# Patient Record
Sex: Male | Born: 1935 | Race: White | Hispanic: No | Marital: Married | Smoking: Never smoker
Health system: Southern US, Community
[De-identification: ages and names within clinical notes are randomized; demographics above are authoritative.]

## PROBLEM LIST (undated history)

## (undated) DIAGNOSIS — R001 Bradycardia, unspecified: Secondary | ICD-10-CM

## (undated) DIAGNOSIS — Z8719 Personal history of other diseases of the digestive system: Secondary | ICD-10-CM

## (undated) DIAGNOSIS — E559 Vitamin D deficiency, unspecified: Secondary | ICD-10-CM

## (undated) DIAGNOSIS — G629 Polyneuropathy, unspecified: Secondary | ICD-10-CM

## (undated) DIAGNOSIS — K219 Gastro-esophageal reflux disease without esophagitis: Secondary | ICD-10-CM

## (undated) DIAGNOSIS — J449 Chronic obstructive pulmonary disease, unspecified: Secondary | ICD-10-CM

## (undated) DIAGNOSIS — R350 Frequency of micturition: Secondary | ICD-10-CM

## (undated) DIAGNOSIS — Z87442 Personal history of urinary calculi: Secondary | ICD-10-CM

## (undated) DIAGNOSIS — Z8546 Personal history of malignant neoplasm of prostate: Secondary | ICD-10-CM

## (undated) DIAGNOSIS — K649 Unspecified hemorrhoids: Secondary | ICD-10-CM

## (undated) DIAGNOSIS — B9681 Helicobacter pylori [H. pylori] as the cause of diseases classified elsewhere: Secondary | ICD-10-CM

## (undated) DIAGNOSIS — E785 Hyperlipidemia, unspecified: Secondary | ICD-10-CM

## (undated) DIAGNOSIS — K269 Duodenal ulcer, unspecified as acute or chronic, without hemorrhage or perforation: Principal | ICD-10-CM

## (undated) DIAGNOSIS — K222 Esophageal obstruction: Secondary | ICD-10-CM

## (undated) DIAGNOSIS — K5790 Diverticulosis of intestine, part unspecified, without perforation or abscess without bleeding: Secondary | ICD-10-CM

## (undated) DIAGNOSIS — C61 Malignant neoplasm of prostate: Secondary | ICD-10-CM

## (undated) DIAGNOSIS — K627 Radiation proctitis: Secondary | ICD-10-CM

## (undated) DIAGNOSIS — M8580 Other specified disorders of bone density and structure, unspecified site: Secondary | ICD-10-CM

## (undated) DIAGNOSIS — M1990 Unspecified osteoarthritis, unspecified site: Secondary | ICD-10-CM

## (undated) DIAGNOSIS — Z87898 Personal history of other specified conditions: Secondary | ICD-10-CM

## (undated) DIAGNOSIS — N400 Enlarged prostate without lower urinary tract symptoms: Secondary | ICD-10-CM

## (undated) DIAGNOSIS — Z8669 Personal history of other diseases of the nervous system and sense organs: Secondary | ICD-10-CM

## (undated) DIAGNOSIS — N529 Male erectile dysfunction, unspecified: Secondary | ICD-10-CM

## (undated) DIAGNOSIS — N201 Calculus of ureter: Secondary | ICD-10-CM

## (undated) DIAGNOSIS — K648 Other hemorrhoids: Secondary | ICD-10-CM

## (undated) DIAGNOSIS — G454 Transient global amnesia: Secondary | ICD-10-CM

## (undated) DIAGNOSIS — Z923 Personal history of irradiation: Secondary | ICD-10-CM

## (undated) DIAGNOSIS — H6690 Otitis media, unspecified, unspecified ear: Secondary | ICD-10-CM

## (undated) DIAGNOSIS — Z972 Presence of dental prosthetic device (complete) (partial): Secondary | ICD-10-CM

## (undated) DIAGNOSIS — H33319 Horseshoe tear of retina without detachment, unspecified eye: Secondary | ICD-10-CM

## (undated) HISTORY — DX: Polyneuropathy, unspecified: G62.9

## (undated) HISTORY — PX: TONSILLECTOMY AND ADENOIDECTOMY: SUR1326

## (undated) HISTORY — DX: Other specified disorders of bone density and structure, unspecified site: M85.80

## (undated) HISTORY — PX: OTHER SURGICAL HISTORY: SHX169

## (undated) HISTORY — DX: Diverticulosis of intestine, part unspecified, without perforation or abscess without bleeding: K57.90

## (undated) HISTORY — DX: Unspecified osteoarthritis, unspecified site: M19.90

## (undated) HISTORY — PX: PROSTATECTOMY: SHX69

## (undated) HISTORY — DX: Male erectile dysfunction, unspecified: N52.9

## (undated) HISTORY — DX: Malignant neoplasm of prostate: C61

## (undated) HISTORY — PX: VASECTOMY: SHX75

## (undated) HISTORY — DX: Duodenal ulcer, unspecified as acute or chronic, without hemorrhage or perforation: K26.9

## (undated) HISTORY — DX: Benign prostatic hyperplasia without lower urinary tract symptoms: N40.0

## (undated) HISTORY — DX: Bradycardia, unspecified: R00.1

## (undated) HISTORY — PX: INNER EAR SURGERY: SHX679

## (undated) HISTORY — DX: Gastro-esophageal reflux disease without esophagitis: K21.9

## (undated) HISTORY — DX: Transient global amnesia: G45.4

## (undated) HISTORY — DX: Horseshoe tear of retina without detachment, unspecified eye: H33.319

## (undated) HISTORY — PX: RETINAL DETACHMENT SURGERY: SHX105

## (undated) HISTORY — DX: Helicobacter pylori (H. pylori) as the cause of diseases classified elsewhere: B96.81

## (undated) HISTORY — PX: INGUINAL HERNIA REPAIR: SUR1180

## (undated) HISTORY — DX: Other hemorrhoids: K64.8

## (undated) HISTORY — PX: ESOPHAGOGASTRODUODENOSCOPY: SHX1529

## (undated) HISTORY — DX: Otitis media, unspecified, unspecified ear: H66.90

## (undated) HISTORY — DX: Esophageal obstruction: K22.2

## (undated) HISTORY — DX: Hyperlipidemia, unspecified: E78.5

## (undated) HISTORY — DX: Vitamin D deficiency, unspecified: E55.9

## (undated) HISTORY — DX: Chronic obstructive pulmonary disease, unspecified: J44.9

## (undated) HISTORY — PX: COLONOSCOPY: SHX174

## (undated) HISTORY — DX: Radiation proctitis: K62.7

---

## 1993-01-04 HISTORY — PX: INGUINAL HERNIA REPAIR: SUR1180

## 2001-01-04 DIAGNOSIS — C61 Malignant neoplasm of prostate: Secondary | ICD-10-CM

## 2001-01-04 HISTORY — DX: Malignant neoplasm of prostate: C61

## 2001-06-27 ENCOUNTER — Encounter (INDEPENDENT_AMBULATORY_CARE_PROVIDER_SITE_OTHER): Payer: Self-pay | Admitting: *Deleted

## 2001-06-27 ENCOUNTER — Inpatient Hospital Stay (HOSPITAL_COMMUNITY): Admission: RE | Admit: 2001-06-27 | Discharge: 2001-06-29 | Payer: Self-pay | Admitting: Urology

## 2001-11-28 ENCOUNTER — Ambulatory Visit (HOSPITAL_BASED_OUTPATIENT_CLINIC_OR_DEPARTMENT_OTHER): Admission: RE | Admit: 2001-11-28 | Discharge: 2001-11-28 | Payer: Self-pay | Admitting: General Surgery

## 2002-11-06 ENCOUNTER — Ambulatory Visit: Admission: RE | Admit: 2002-11-06 | Discharge: 2003-01-17 | Payer: Self-pay | Admitting: Radiation Oncology

## 2003-02-12 ENCOUNTER — Ambulatory Visit: Admission: RE | Admit: 2003-02-12 | Discharge: 2003-02-12 | Payer: Self-pay | Admitting: Radiation Oncology

## 2003-07-16 ENCOUNTER — Ambulatory Visit: Admission: RE | Admit: 2003-07-16 | Discharge: 2003-07-16 | Payer: Self-pay | Admitting: Radiation Oncology

## 2004-01-27 ENCOUNTER — Ambulatory Visit: Payer: Self-pay | Admitting: Internal Medicine

## 2004-02-13 ENCOUNTER — Ambulatory Visit: Payer: Self-pay | Admitting: Internal Medicine

## 2006-11-30 ENCOUNTER — Encounter: Admission: RE | Admit: 2006-11-30 | Discharge: 2006-11-30 | Payer: Self-pay | Admitting: Internal Medicine

## 2008-02-19 ENCOUNTER — Ambulatory Visit: Payer: Self-pay | Admitting: Vascular Surgery

## 2009-02-17 ENCOUNTER — Encounter (INDEPENDENT_AMBULATORY_CARE_PROVIDER_SITE_OTHER): Payer: Self-pay | Admitting: *Deleted

## 2009-02-25 ENCOUNTER — Encounter (INDEPENDENT_AMBULATORY_CARE_PROVIDER_SITE_OTHER): Payer: Self-pay | Admitting: *Deleted

## 2009-02-28 ENCOUNTER — Ambulatory Visit: Payer: Self-pay | Admitting: Internal Medicine

## 2009-03-12 ENCOUNTER — Ambulatory Visit: Payer: Self-pay | Admitting: Internal Medicine

## 2009-10-13 ENCOUNTER — Encounter: Admission: RE | Admit: 2009-10-13 | Discharge: 2009-10-13 | Payer: Self-pay | Admitting: Internal Medicine

## 2010-02-03 NOTE — Miscellaneous (Signed)
Summary: RECALL COL.Marland KitchenMarland KitchenEM  Clinical Lists Changes  Medications: Added new medication of MOVIPREP 100 GM  SOLR (PEG-KCL-NACL-NASULF-NA ASC-C) As directed - Signed Rx of MOVIPREP 100 GM  SOLR (PEG-KCL-NACL-NASULF-NA ASC-C) As directed;  #1 x 0;  Signed;  Entered by: Clide Cliff RN;  Authorized by: Iva Boop MD, FACG;  Method used: Electronically to [facility] [address] [place], [place], [place]  [address], Ph: [phone] or [phone], Fax: [phone] Observations: Added new observation of ALLERGY REV: Done (02/28/2009 10:10)    Prescriptions: MOVIPREP 100 GM  SOLR (PEG-KCL-NACL-NASULF-NA ASC-C) As directed  #1 x 0   Entered by:   Clide Cliff RN   Authorized by:   Iva Boop MD, [facility]   Signed by:   Clide Cliff RN on 02/28/2009   Method used:   Electronically to        [facility] [address] [phone]* (retail)       [address]       [place], [place]  [address]       Ph: [phone] or [phone]       Fax: [phone]   RxID:   [id number]

## 2010-02-03 NOTE — Procedures (Signed)
Summary: Colonoscopy  Patient: Dennis Reynolds Note: All result statuses are Final unless otherwise noted.  Tests: (1) Colonoscopy (COL)   COL Colonoscopy           DONE     [facility]     [address]     [place], [place]  [address]           COLONOSCOPY PROCEDURE REPORT           PATIENT:  Dennis Reynolds, Dennis Reynolds  MR#:  [id number]     BIRTHDATE:  October 20, 1931, 77 yrs. old  GENDER:  male           ENDOSCOPIST:  Iva Boop, MD, [facility]           PROCEDURE DATE:  03/12/2009     PROCEDURE:  Colonoscopy 19[address]     ASA CLASS:  Class II     INDICATIONS:  Elevated Risk Screening, family history of colon     cancer sibling with colon cancer before age 31           MEDICATIONS:   Fentanyl 50 mcg IV, Versed 5 mg IV           DESCRIPTION OF PROCEDURE:   After the risks benefits and     alternatives of the procedure were thoroughly explained, informed     consent was obtained.  Digital rectal exam was performed and     revealed that the prostate was surgically absent.   The LB     CF-[id number] endoscope was introduced through the anus and     advanced to the terminal ileum which was intubated for a short     distance, without limitations.  The quality of the prep was     excellent, using MoviPrep.  The instrument was then slowly     withdrawn as the colon was fully examined.     Insertion: 3:40 minutes Withdrawal: 8:27 minutes     <<PROCEDUREIMAGES>>           FINDINGS:  Moderate diverticulosis was found in the sigmoid colon.     There were mucosal changes consistent with radiation proctitis     seen in the rectum. in the rectum. Mild changes of telangiectasia     in the distal rectum.  This was otherwise a normal examination of     the colon.   Retroflexed views in the rectum revealed no other     findings other than those already described.    The scope was then     withdrawn from the patient and the procedure completed.           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     If that is unhelpful and he desired, call Dr. Marvell Fuller office     to arrange a visit.           REPEAT EXAM:  In 5 year(s) for routine screening colonocsopy.           Iva Boop, MD, [facility]           CC:  The Patient     Creola Corn, MD           n.     Rosalie Doctor:   Iva Boop at 03/12/2009 10:59 AM           Maness, Cai, [place] [phone]  Note: An exclamation mark (!) indicates a result that was not dispersed into the flowsheet. Document Creation Date: 03/12/2009 10:59 AM _______________________________________________________________________  (1) Order result status: Final Collection or observation date-time: 03/12/2009 10:49 Requested date-time:  Receipt date-time:  Reported date-time:  Referring Physician:   Ordering Physician: Stan Head [phone]) Specimen Source:  Source: Launa Grill Order Number: [id number] Lab site:   Appended Document: Colonoscopy    Clinical Lists Changes  Observations: Added new observation of COLONNXTDUE: 03/2014 (03/12/2009 13:04)

## 2010-02-03 NOTE — Letter (Signed)
Summary: [facility] Instructions  [facility]  [address] [place], [place] [address]   Phone: [phone]  Fax: [phone]       Dennis Reynolds    Sep 15, 1931    MRN: [id number]        Procedure Day Dorna Bloom: Wednesday 03/12/2009     Arrival Time: 9:00 am      Procedure Time: 10:00 am     Location of Procedure:                    _x _  [facility] (4th Floor)   PREPARATION FOR COLONOSCOPY WITH MOVIPREP   Starting 5 days prior to your procedure Friday 3/4 do not eat nuts, seeds, popcorn, corn, beans, peas,  salads, or any raw vegetables.  Do not take any fiber supplements (e.g. Metamucil, Citrucel, and Benefiber).  THE DAY BEFORE YOUR PROCEDURE         DATE: Tuesday 3/8  1.  Drink clear liquids the entire day-NO SOLID FOOD  2.  Do not drink anything colored red or purple.  Avoid juices with pulp.  No orange juice.  3.  Drink at least 64 oz. (8 glasses) of fluid/clear liquids during the day to prevent dehydration and help the prep work efficiently.  CLEAR LIQUIDS INCLUDE: Water Jello Ice Popsicles Tea (sugar ok, no milk/cream) Powdered fruit flavored drinks Coffee (sugar ok, no milk/cream) Gatorade Juice: apple, white grape, white cranberry  Lemonade Clear bullion, consomm, broth Carbonated beverages (any kind) Strained chicken noodle soup Hard Candy                             4.  In the morning, mix first dose of MoviPrep solution:    Empty 1 Pouch A and 1 Pouch B into the disposable container    Add lukewarm drinking water to the top line of the container. Mix to dissolve    Refrigerate (mixed solution should be used within 24 hrs)  5.  Begin drinking the prep at 5:00 p.m. The MoviPrep container is divided by 4 marks.   Every 15 minutes drink the solution down to the next mark (approximately 8 oz) until the full liter is complete.   6.  Follow completed prep with 16 oz of clear liquid of your choice (Nothing red or purple).  Continue  to drink clear liquids until bedtime.  7.  Before going to bed, mix second dose of MoviPrep solution:    Empty 1 Pouch A and 1 Pouch B into the disposable container    Add lukewarm drinking water to the top line of the container. Mix to dissolve    Refrigerate  THE DAY OF YOUR PROCEDURE      DATE: Wednesday 3/9  Beginning at 5:00 a.m. (5 hours before procedure):         1. Every 15 minutes, drink the solution down to the next mark (approx 8 oz) until the full liter is complete.  2. Follow completed prep with 16 oz. of clear liquid of your choice.    3. You may drink clear liquids until 8:00 am (2 HOURS BEFORE PROCEDURE).   MEDICATION INSTRUCTIONS  Unless otherwise instructed, you should take regular prescription medications with a small sip of water   as early as possible the morning of your procedure.           OTHER INSTRUCTIONS  You will need a responsible adult at  least 75 years of age to accompany you and drive you home.   This person must remain in the waiting room during your procedure.  Wear loose fitting clothing that is easily removed.  Leave jewelry and other valuables at home.  However, you may wish to bring a book to read or  an iPod/MP3 player to listen to music as you wait for your procedure to start.  Remove all body piercing jewelry and leave at home.  Total time from sign-in until discharge is approximately 2-3 hours.  You should go home directly after your procedure and rest.  You can resume normal activities the  day after your procedure.  The day of your procedure you should not:   Drive   Make legal decisions   Operate machinery   Drink alcohol   Return to work  You will receive specific instructions about eating, activities and medications before you leave.    The above instructions have been reviewed and explained to me by   Clide Cliff, RN_____________________    I fully understand and can verbalize these instructions  _____________________________ Date _________

## 2010-02-03 NOTE — Letter (Signed)
Summary: Colonoscopy Letter  [facility]  [address] [place], [place] [address]   Phone: [phone]  Fax: [phone]      February 17, 2009 MRN: [id number]   [facility] [address] [place], [place]  [address]   Dear Mr. MANESS,   According to your medical record, it is time for you to schedule a Colonoscopy. The American Cancer Society recommends this procedure as a method to detect early colon cancer. Patients with a family history of colon cancer, or a personal history of colon polyps or inflammatory bowel disease are at increased risk.  This letter has beeen generated based on the recommendations made at the time of your procedure. If you feel that in your particular situation this may no longer apply, please contact our office.  Please call our office at [phone] to schedule this appointment or to update your records at your earliest convenience.  Thank you for cooperating with [place] to provide you with the very best care possible.   Sincerely,  Iva Boop, M.D.  [facility] [phone]

## 2010-05-22 NOTE — Op Note (Signed)
[facility]  Patient:    Dennis Reynolds, Dennis Reynolds Visit Number: [id number] MRN: [id number]          Service Type: SUR Location: 3W 0352 01 Attending Physician:  Evlyn Clines Dictated by:   Excell Seltzer. Annabell Howells, M.D. Proc. Date: 06/27/01 Admit Date:  06/27/2001 Discharge Date: 06/29/2001   CC:         Gwen Pounds, M.D.   Operative Report  PREOPERATIVE DIAGNOSIS:  TIC Gleason VI prostate cancer.  POSTOPERATIVE DIAGNOSIS:  T1C Gleason VI prostate cancer.  PROCEDURE:  Radical retropubic prostatectomy with bilateral pelvic lymphadenectomy.  SURGEON:  Excell Seltzer. Annabell Howells, M.D.  ASSISTANT:  Lucrezia Starch. Ovidio Hanger, M.D.  ANESTHESIA:  General.  DRAINS:  Foley catheter.  COMPLICATIONS:  None.  ESTIMATED BLOOD LOSS:  300 cc.  INDICATIONS:  The patient is a 77 year old white male who was found to have an elevated PSA of 6.26.  At biopsy, he had a Gleason VI adenocarcinoma of the prostate involving 10% of the right biopsies and 5% of the left biopsies.  He was felt to be a T1CNXMX cancer.  He elected surgical therapy.  FINDINGS AND DESCRIPTION OF PROCEDURE:  The patient was taken to the operating room where general anesthetic was induced.  He had been fitted with thigh high TED hose preoperatively and given 1 g of Ancef and had taken a bowel prep prior as well.  He was placed in the supine position with slight flex in the table and the head down slightly.  His lower abdomen was shaved.  He was prepped with Betadine solution and draped in the usual sterile fashion.  A Foley catheter was inserted and the bladder was drained.  A lower midline incision was made from the pubis to the umbilicus with the knife.  This was carried down through the subcutaneous and musculofascial layers.  Hemostasis was achieved with the Bovie.  The transversalis fascia was opened, and the right and left pelvic fossa were exposed with blunt dissection.  A _______ retractor was placed on  the right and no dissection was performed.  The limits of the dissection were the external iliac vein, the circumflex iliac vein, the obturator nerve, and the bifurcation of the iliac artery.  No obvious gross nodal disease was noted.  The lymphatic and vascular channels were controlled with Hemolock clips.  The left node dissection was then performed in an identical fashion.  Once the node dissection had been performed, the endopelvic fascia was exposed on each side of the prostate, and the right side was punctured with the tip of the scissors.  A finger was placed and blunt dissection was performed.  This was repeated on the left.  A right angle was then placed under an anterior leaf of the endopelvic fascia, and incised along the lateral aspect of the prostate.  The edges of the endopelvic fascia on each side were then grasped with an Allis clamp, and compressed over the prostate.  A figure-of-eight 2-0 Vicryl was then placed to control back bleeding.  A Howenfelder clamp was then placed beneath the dorsal vein complex, and a #1 Vicryl was then passed and tied.  The dorsal vein complex was then divided using the Bovie.  Once the anterior urethra was opened, a Vanderbilt clamp was used to dissect the urethra from the neurovascular bundles laterally, and a moistened umbilical tape was then placed beneath the urethra.  The Foley catheter was drawn into the wound, clamped, and cut with cephalad traction  on the prostate. The posterior wall of the urethra was then divided.  Some residual apical attachments to the prostate were taken down to the level of Denonvilliers fascia.  The prostate was then dissected posteriorly bluntly, freeing it up from the anterior rectum.  The lateral pedicles were then taken down using a right angle clamp and clips as well as the Bovie on the prostate side.  Great care was taken to avoid injury to the neurovascular bundle.  Once the prostate was reflected  sufficiently cephalad, the anterior leaf of Denonvilliers fascia was incised with the Bovie over the seminal vesicles and they were exposed as well.  At this point, we turned our attention anteriorly.  The bladder neck was grasped between Allis and incised with the Bovie.  Once the bladder was opened, the Foley catheter balloon was deflated.  The Foley was brought out of the bladder and used to provide traction on the prostate.  The patient had received Indigo Carmine.  The ureteral orifices were identified and avoided. The posterior bladder neck was then divided with great care being taken to avoid thinning of the bladder neck.  The ampulla of the vas were exposed. These were divided using large clips and the scissors.  The seminal vesicles were dissected out.  Large clips were placed across the tip of the seminal vesicles and they were divided.  The specimen was removed from the field. Examination of the pelvic floor revealed no significant bleeding.  At this point, the bladder neck mucosa was everted using interrupted 4-0 chromic stitches, and the bladder neck was reconstructed in a tennis racquet fashion using a 2-0 chromic stitch.  Only a couple of rows were required to tightened the bladder neck sufficiently.  At this point, a fresh Foley catheter was inserted, and anastomotic sutures were placed using 2-0 Vicryl at 7, 10, 12, 2, and 5 oclock.  Prior to placing the 12 oclock stitch, a #1 Prolene was placed in the eyes of the catheter, tied, and then brought through the bladder neck, and out through the anterior bladder wall with a large needle.  The Foley was placed in the bladder.  The balloon was filled with 15 cc of sterile fluid, and the final anastomotic stitch was placed.  With this stitch, the anterior bladder neck was tightened slightly more to ensure a snug anastomosis.  At this point, the retractors were loosened, and traction was placed on the  Foley.  The anastomotic  sutures were tied and trimmed.  The bladder was irrigated.  The anastomosis was found to be watertight.  At this point, the retractors were removed.  A #10 Blake drain was placed through a separate stab wound to the left of the incision.  The tethering suture was brought through the right abdominal wall and tied over a button. The anterior rectus fascia was closed with a running #1 PDS.  The skin was closed with clips.  A dressing was applied and the Foley was placed to straight drain and maintained on light traction.  The patients anesthetic was reversed.  He was moved to the recovery room in stable condition.  There were no complications during the procedure. Dictated by:   Excell Seltzer. Annabell Howells, M.D. Attending Physician:  Evlyn Clines DD:  06/27/01 TD:  06/28/01 Job: [id number]

## 2010-05-22 NOTE — Op Note (Signed)
NAME:  Dennis Reynolds, Dennis Reynolds NO.:  [id number]   MEDICAL RECORD NO.:  [id number]                   PATIENT TYPE:  AMB   LOCATION:  DSC                                  FACILITY:  [facility]   PHYSICIAN:  Gabrielle Dare. Janee Morn, M.D.             DATE OF BIRTH:  November 07, 1931   DATE OF PROCEDURE:  11/28/2001  DATE OF DISCHARGE:                                 OPERATIVE REPORT   PREOPERATIVE DIAGNOSIS:  Right inguinal hernia.   POSTOPERATIVE DIAGNOSIS:  Right inguinal hernia.   PROCEDURE:  Repair of right inguinal hernia with mesh.   ANESTHESIA:  MAC.   SURGEON:  Gabrielle Dare. Janee Morn, M.D.   CLINICAL NOTE:  The patient is a 77 year old male who presented to the  office complaining of a long history of a right inguinal hernia.  It had  recently been bothering him more after he had some prostate surgery, and he  is scheduled for elective repair.   DESCRIPTION OF PROCEDURE:  The patient was brought to the operating room, IV  antibiotics had been given.  He underwent MAC anesthesia, and his abdomen  was prepped and draped in sterile fashion.  The right groin incision was  made, subcutaneous tissues were dissected down, Scarpa's fascia was divided,  and the fascia of the external oblique came into view.  This was sharply  divided and the division carried down through the external ring. The two  leaflets of the external oblique were then bluntly dissected from the  aponeurosis superiorly and the inguinal ligament inferiorly.  The cord  structures were then encircled with a Penrose drain.  Subsequently the  hernia sac was identified anteromedially with the cord structure bundle, and  it was bluntly dissected from the cremaster fibers and the vas and the  vascular structures to the cord.  Subsequently once the sac was completely  mobilized from the cord structures, it was ensured that it was emptied of  all contents and it was twisted and high ligated with a 3-0 Vicryl suture  and removed.  Subsequently the hernia was repaired with a polypropylene  keyhole mesh, which was sewn inferiorly from the pubic tubercle along the  shelving edge of the inguinal ligament with 0 Prolene suture.  The superior  edge of the mesh was sutured from the pubic tubercle to along the  aponeurosis with a series of interrupted 0 Prolene sutures.  The ring was  reconstructed by joining the two leaflets of the keyhole mesh lateral to the  cord structures.  The medial part of the keyhole was actually enlarged  slightly to admit a pinkie fingertip and then the cord structures appeared  nicely viable.  The area was copiously irrigated.  Meticulous hemostasis was  obtained.  Cord structures appeared in good condition, and the external  oblique fascia was closed again with a running 3-0 Vicryl stitch.  Marcaine  0.25% was infused into  the skin and the fascial area and laterally out  toward the anterior superior iliac spine.  Ten cubic centimeters were used  for postoperative pain control.  Subsequently Scarpa's fascia was closed  with three interrupted 3-0 Vicryl sutures, and the skin was closed with a  running 4-0 Monocryl subcuticular stitch.  Instrument, sponge, and needle  counts were all correct.  Benzoin and Steri-Strips and a sterile dressing  were applied.  The patient tolerated the procedure well without  complications and was taken to the recovery room in stable condition.                                              Gabrielle Dare Janee Morn, M.D.   BET/MEDQ  D:  11/28/2001  T:  11/28/2001  Job:  [id number]

## 2011-01-05 DIAGNOSIS — Z8719 Personal history of other diseases of the digestive system: Secondary | ICD-10-CM

## 2011-01-05 HISTORY — DX: Personal history of other diseases of the digestive system: Z87.19

## 2011-03-11 ENCOUNTER — Telehealth: Payer: Self-pay | Admitting: Internal Medicine

## 2011-03-11 NOTE — Telephone Encounter (Signed)
Recent iFOBT test + Has known radiation proctitis on 2011 colonoscopy.Unless he is anemic or has significant bowel habit changes he does not need a colonoscopy.  I do not recommend that he have routine hemoccults since he has had a colonoscopy and is 79.

## 2011-03-12 ENCOUNTER — Encounter: Payer: Self-pay | Admitting: Internal Medicine

## 2011-03-30 ENCOUNTER — Encounter: Payer: Self-pay | Admitting: Internal Medicine

## 2011-03-30 ENCOUNTER — Ambulatory Visit (INDEPENDENT_AMBULATORY_CARE_PROVIDER_SITE_OTHER): Payer: Medicare Other | Admitting: Internal Medicine

## 2011-03-30 VITALS — BP 120/68 | HR 64 | Ht 69.0 in | Wt 162.2 lb

## 2011-03-30 DIAGNOSIS — R1310 Dysphagia, unspecified: Secondary | ICD-10-CM

## 2011-03-30 DIAGNOSIS — R1314 Dysphagia, pharyngoesophageal phase: Secondary | ICD-10-CM

## 2011-03-30 DIAGNOSIS — R195 Other fecal abnormalities: Secondary | ICD-10-CM

## 2011-03-30 MED ORDER — PEG-KCL-NACL-NASULF-NA ASC-C 100 G PO SOLR: 1.0000 | Freq: Once | ORAL | Status: DC

## 2011-03-30 NOTE — Patient Instructions (Signed)
You have been scheduled for an endoscopy and colonoscopy. Please follow the written instructions given to you at your visit today. Please pick up your prep at the pharmacy within the next 1-3 days.

## 2011-03-30 NOTE — Progress Notes (Signed)
  Subjective:    Patient ID: Dennis Reynolds, male    DOB: Jan 07, 1931, 78 y.o.   MRN: [id number]  HPI Heme + iFOBT recently - routine test. Last colonoscopy 02/2009 - internal hemorrhoids and radiation proctitis. Has seen a rare spot of blood.  Urgent defecation post-prandial usually, about 1 x a week. Stool is liquid and dark brown. Needs to defecate within 10 minutes of borborygmi that precedes this. Otherwise able to put off defecation if required. Cannot associate it with any particular food.  Got better with eliminating fish oil in 2011 (at time of last colonoscopy) Still occasionally a problem and concerned about travelling on bus trips, etc.  Chronic recurrent solid dysphagia. Most of the time water relieves the problem but has had to regurgitate. Has dysphagia about 1 x week. No Known Allergies No outpatient prescriptions prior to visit.   Past Medical History  Diagnosis Date  . Diverticulosis   . Internal hemorrhoid   . Prostate cancer 2003  . Arthritis    Past Surgical History  Procedure Date  . Colonoscopy     multiple  . Tonsillectomy and adenoidectomy   . Prostatectomy   . Inner ear surgery     right  . Inguinal hernia repair 1995    right  . Retinal detachment surgery     x 2, left  . Other surgical history     radiation for prostate   History   Social History  . Marital Status: Married    Spouse Name: N/A    Number of Children: 2  .     Occupational History  . retired IT sales professional    Social History Main Topics  . Smoking status: Never Smoker   . Smokeless tobacco: Never Used  . Alcohol Use: No  . Drug Use: No    Family History  Problem Relation Age of Onset  . Colon cancer Sister   . Heart disease Mother   . Prostate cancer Father   . Parkinsonism Father          Review of Systems Arthritis pain All other ROS negative or as per HPI    Objective:   Physical Exam General:  NAD Eyes:   anicteric Lungs:  clear Heart:  S1S2 no rubs,  murmurs or gallops Abdomen:  soft and nontender, BS+ Ext:   no edema    Data Reviewed:  Dr. Ferd Hibbs office notes. Labs Feb 2013 - CBC normal       Assessment & Plan:   1. Nonspecific abnormal finding in stool contents   2. Esophageal dysphagia    Suspect heme + stool (iFOBT) is from radiation proctitis. He does have chronic intermittent loose stools and a family hx of colon cancer. Will evaluate with colonoscopy to evaluate for colorectal neoplasia or other causes. The risks and benefits as well as alternatives of endoscopic procedure(s) have been discussed and reviewed. All questions answered. The patient agrees to proceed.  Years of intermittent dysphagia - sounds like a ring or peptic stricture. Plan for EGD and dilation. May need chronic PPI. The risks and benefits as well as alternatives of endoscopic procedure(s) have been discussed and reviewed. All questions answered. The patient agrees to proceed.  Cc: Dr. Creola Corn

## 2011-04-23 ENCOUNTER — Encounter: Payer: Self-pay | Admitting: Internal Medicine

## 2011-04-23 ENCOUNTER — Ambulatory Visit (AMBULATORY_SURGERY_CENTER): Payer: Medicare Other | Admitting: Internal Medicine

## 2011-04-23 VITALS — BP 134/72 | HR 59 | Temp 96.5°F | Resp 20 | Ht 69.0 in | Wt 162.0 lb

## 2011-04-23 DIAGNOSIS — K2980 Duodenitis without bleeding: Secondary | ICD-10-CM

## 2011-04-23 DIAGNOSIS — R195 Other fecal abnormalities: Secondary | ICD-10-CM

## 2011-04-23 DIAGNOSIS — A048 Other specified bacterial intestinal infections: Secondary | ICD-10-CM

## 2011-04-23 DIAGNOSIS — K627 Radiation proctitis: Secondary | ICD-10-CM

## 2011-04-23 DIAGNOSIS — K269 Duodenal ulcer, unspecified as acute or chronic, without hemorrhage or perforation: Secondary | ICD-10-CM

## 2011-04-23 DIAGNOSIS — K222 Esophageal obstruction: Secondary | ICD-10-CM

## 2011-04-23 DIAGNOSIS — R1314 Dysphagia, pharyngoesophageal phase: Secondary | ICD-10-CM

## 2011-04-23 DIAGNOSIS — K5730 Diverticulosis of large intestine without perforation or abscess without bleeding: Secondary | ICD-10-CM

## 2011-04-23 DIAGNOSIS — K6289 Other specified diseases of anus and rectum: Secondary | ICD-10-CM

## 2011-04-23 MED ORDER — SODIUM CHLORIDE 0.9 % IV SOLN: 500.0000 mL | INTRAVENOUS | Status: DC

## 2011-04-23 MED ORDER — PANTOPRAZOLE SODIUM 40 MG PO TBEC: 40.0000 mg | DELAYED_RELEASE_TABLET | Freq: Every day | ORAL | Status: DC

## 2011-04-23 NOTE — Patient Instructions (Signed)

## 2011-04-23 NOTE — Progress Notes (Signed)
Patient did not have preoperative order for IV antibiotic SSI prophylaxis. (G8918)  Patient did not experience any of the following events: a burn prior to discharge; a fall within the facility; wrong site/side/patient/procedure/implant event; or a hospital transfer or hospital admission upon discharge from the facility. (G8907)  

## 2011-04-23 NOTE — Op Note (Signed)
[facility] [address] [place], [place]  [address]  COLONOSCOPY PROCEDURE REPORT  PATIENT:  Dennis Reynolds, Dennis Reynolds  MR#:  [id number] BIRTHDATE:  [birth year], 79 yrs. old  GENDER:  male ENDOSCOPIST:  Iva Boop, MD, [facility]  PROCEDURE DATE:  04/23/2011 PROCEDURE:  Colonoscopy 19147 ASA CLASS:  Class II INDICATIONS:  heme positive stool MEDICATIONS:   There was residual sedation effect present from prior procedure., These medications were titrated to patient response per physician's verbal order, Fentanyl 25 mcg IV, Versed 1 mg IV  DESCRIPTION OF PROCEDURE:   After the risks benefits and alternatives of the procedure were thoroughly explained, informed consent was obtained.  Digital rectal exam was performed and revealed no abnormalities.   The LB CF-H180AL [id number] endoscope was introduced through the anus and advanced to the terminal ileum which was intubated for a short distance, without limitations. The quality of the prep was excellent, using MoviPrep.  The instrument was then slowly withdrawn as the colon was fully examined. <<PROCEDUREIMAGES>> FINDINGS:  Moderate diverticulosis was found in the sigmoid colon. 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 in the rectum. Mild in distal rectum.  This was otherwise a normal examination of the colon and termina l ikleum.   Retroflexed views in the rectum revealed no other findings other than those already described.    The time to cecum = 2:52 minutes. The scope was then withdrawn in 8:45 minutes from the cecum and the procedure completed. 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 May consider repeat colonoscopy in 5 years if vigorous (has family  history of colon cancer) but reasonable to stop routine procedures given age - defer to primary care and patient. Try Imodium AD (loperamide) 2 mg 1-2 as needed to control/prevent episodic diarrhea  Iva Boop, MD, [facility]  CC:  Creola Corn, MD and The Patient  n. eSIGNED:   Iva Boop at 04/23/2011 05:01 PM  Maness, Fayrene Fearing, [phone]

## 2011-04-23 NOTE — Op Note (Signed)
[facility] [address] [place], [place]  [address]  ENDOSCOPY PROCEDURE REPORT  PATIENT:  Dennis Reynolds, Dennis Reynolds  MR#:  #[id number] BIRTHDATE:  1931-06-24, 79 yrs. old  GENDER:  male  ENDOSCOPIST:  Iva Boop, MD, [facility]  PROCEDURE DATE:  04/23/2011 PROCEDURE:  EGD with biopsy, 43239, Elease Hashimoto Dilation of Esophagus ASA CLASS:  Class II INDICATIONS:  dysphagia  MEDICATIONS:   These medications were titrated to patient response per physician's verbal order, Versed 7 mg IV, Fentanyl 50 mcg IV TOPICAL ANESTHETIC:  Cetacaine Spray  DESCRIPTION OF PROCEDURE:   After the risks benefits and alternatives of the procedure were thoroughly explained, informed consent was obtained.  The [facility] GIF-H180 [id number] endoscope was introduced through the mouth and advanced to the second portion of the duodenum, without limitations.  The instrument was slowly withdrawn as the mucosa was fully examined. <<PROCEDUREIMAGES>>  A stricture (ring-like) was found in the distal esophagus. Abnormal appearing mucosa at the gastroesophageal junction. Prominent folds. Multiple biopsies were obtained and sent to pathology.  An ulcer was found in the bulb of the duodenum. Linear ulcer 1cm long with deformed bulb, ? diverticulem. Edema and erythema also. Multiple biopsies were obtained and sent to pathology.  Otherwise the examination was normal. CLO biopsies taken from antrum to look for H. pylori.     Retroflexed views revealed no abnormalities.    The scope was then withdrawn from the patient, a 65 [place] Maloney dilator was passed revealing slight heme (after biopsies) and the procedure completed.  COMPLICATIONS:  None  ENDOSCOPIC IMPRESSION: 1) Stricture in the distal esophagus - dilated to 54 French 2) Abnormal mucosa at the gastroesophageal junction - prominent folds biopsied 3) Ulcer in the bulb of duodenum - linear ulcer with bulb deformity - biopsied 4) Otherwise normal  examination RECOMMENDATIONS: 1) post-dilation diet today 2) start pantoprazole 40 mg daily to reduce recurrent stricture formation - prescription sent to pharmacy 3) letter to patient re: pathology and follow-up plans 4) colonoscopy next  REPEAT EXAM:  as needed  Iva Boop, MD, [facility]  CC:  Creola Corn, MD and The Patient  n. eSIGNED:   Iva Boop at 04/23/2011 04:53 PM  Maness Freida Busman, [phone]

## 2011-04-26 ENCOUNTER — Encounter: Payer: Self-pay | Admitting: Internal Medicine

## 2011-04-26 ENCOUNTER — Telehealth: Payer: Self-pay | Admitting: *Deleted

## 2011-04-26 LAB — HELICOBACTER PYLORI SCREEN-BIOPSY: UREASE: POSITIVE

## 2011-04-26 NOTE — Telephone Encounter (Signed)
  Follow up Call-  Call back number 04/23/2011  Post procedure Call Back phone  # [phone]  Permission to leave phone message Yes     Patient questions:  Do you have a fever, pain , or abdominal swelling? no Pain Score  0 *  Have you tolerated food without any problems? yes  Have you been able to return to your normal activities? yes  Do you have any questions about your discharge instructions: Diet   no Medications  no Follow up visit  no  Do you have questions or concerns about your Care? no  Actions: * If pain score is 4 or above: No action needed, pain <4.

## 2011-04-28 ENCOUNTER — Encounter: Payer: Medicare Other | Admitting: Internal Medicine

## 2011-05-03 ENCOUNTER — Other Ambulatory Visit: Payer: Self-pay

## 2011-05-03 MED ORDER — BIS SUBCIT-METRONID-TETRACYC 140-125-125 MG PO CAPS: 3.0000 | ORAL_CAPSULE | Freq: Three times a day (TID) | ORAL | Status: DC

## 2011-05-03 NOTE — Progress Notes (Signed)
Quick Note:  Office  Let him know he has H. Pylori and that needs to be treated. See if he can get Pylera for 10 days If not then amoxicillin 1000 mg bid and biaxin 500 mg bid for 10 days  He is on a PPI already  Needs follow-up me in 2 months  LEC  No letter or recall ______

## 2011-05-05 ENCOUNTER — Telehealth: Payer: Self-pay

## 2011-05-05 MED ORDER — AMOXICILLIN 500 MG PO CAPS: 1000.0000 mg | ORAL_CAPSULE | Freq: Two times a day (BID) | ORAL | Status: AC

## 2011-05-05 MED ORDER — CLARITHROMYCIN 500 MG PO TABS: 500.0000 mg | ORAL_TABLET | Freq: Two times a day (BID) | ORAL | Status: AC

## 2011-05-05 NOTE — Telephone Encounter (Signed)
Sent Rx for Amoxicillin and Biaxin to [facility] on [address] per Dr. Leone Payor to replace the Pylera Rx that is not NDC covered.  LM for Pt informing him of what we were sending in.  He is to continue his PPI.

## 2011-05-06 ENCOUNTER — Telehealth: Payer: Self-pay

## 2011-05-06 NOTE — Telephone Encounter (Signed)
Pt informed that rx's at [facility] for him to start.  He has just returned to town. He will call back to set up his 2 month f/u appt.

## 2011-07-02 ENCOUNTER — Ambulatory Visit (INDEPENDENT_AMBULATORY_CARE_PROVIDER_SITE_OTHER): Payer: Medicare Other | Admitting: Internal Medicine

## 2011-07-02 ENCOUNTER — Encounter: Payer: Self-pay | Admitting: Internal Medicine

## 2011-07-02 VITALS — BP 132/60 | HR 60 | Ht 69.0 in | Wt 160.0 lb

## 2011-07-02 DIAGNOSIS — K269 Duodenal ulcer, unspecified as acute or chronic, without hemorrhage or perforation: Secondary | ICD-10-CM

## 2011-07-02 DIAGNOSIS — K627 Radiation proctitis: Secondary | ICD-10-CM

## 2011-07-02 DIAGNOSIS — K219 Gastro-esophageal reflux disease without esophagitis: Secondary | ICD-10-CM

## 2011-07-02 DIAGNOSIS — B9681 Helicobacter pylori [H. pylori] as the cause of diseases classified elsewhere: Secondary | ICD-10-CM

## 2011-07-02 DIAGNOSIS — K589 Irritable bowel syndrome without diarrhea: Secondary | ICD-10-CM | POA: Insufficient documentation

## 2011-07-02 DIAGNOSIS — K6289 Other specified diseases of anus and rectum: Secondary | ICD-10-CM

## 2011-07-02 DIAGNOSIS — A048 Other specified bacterial intestinal infections: Secondary | ICD-10-CM

## 2011-07-02 MED ORDER — ALIGN PO CAPS: 1.0000 | ORAL_CAPSULE | Freq: Every day | ORAL | Status: AC

## 2011-07-02 NOTE — Patient Instructions (Addendum)
Today we are giving you samples of Align to try one a day, we also included a $10.00 coupon if it helps stay on it.  You may try 1 -2 Imodium over the counter to stop bowel urgency and see if that helps, esp. When you travel.  Follow-up as needed with Dr. Leone Payor.

## 2011-07-02 NOTE — Progress Notes (Signed)
Patient ID: Dennis Reynolds., male   DOB: 1931-10-14, 78 y.o.   MRN: [id number]  Patient returns for followup of several conditions.  He is not having any more dysphagia after dilation of his esophageal stricture. He has not been totally compliant with his pantoprazole prescription because he does need breakfast in the mornings and he was told to take this before breakfast. He is not having significant heartburn.  His urgent defecation problems are improved though he is still nervous about it. He is planning to take a trip on a bus with a lavatory later this year. He has not tried any Imodium.  He took amoxicillin and Biaxin along with this PPI to treat H. pylori duodenal ulcer. That went fine.  He has radiation proctitis which was the source of his heme positive stool but does not have any rectal bleeding there is obvious.  Medications, allergies, past medical history, past surgical history, family history and social history are reviewed and updated in the EMR.   Physical exam limited the vital signs in general appearance, he is a healthy-appearing elderly white man.  Assessment and plan:  1. Duodenal ulcer due to Helicobacter pylori   Status post amoxicillin, Biaxin and PPI for 10 days. He should be adequately treated.   2. GERD with stricture   Significantly improved with no dysphagia. He is reminded to take his PPI daily, I've explained the rationale and a rather him take it on an empty stomach as opposed to not at all.   3. IBS (irritable bowel syndrome)   Intermittent urgent defecation with fear of incontinence. Add Align to see if that makes a difference. Still consider intermittent prophylactic Imodium.   4. Radiation proctitis   This is asymptomatic but did cause his heme positive stool. I would not do further Hemoccults on this patient.    He will see me as needed otherwise.  I appreciate the opportunity to care for this patient.  CC: Gwen Pounds, MD

## 2013-01-15 ENCOUNTER — Ambulatory Visit (HOSPITAL_COMMUNITY)
Admission: RE | Admit: 2013-01-15 | Discharge: 2013-01-15 | Disposition: A | Discharge status: Home / Self Care | Payer: Medicare HMO | Source: Ambulatory Visit | Attending: Surgery | Admitting: Surgery

## 2013-01-15 ENCOUNTER — Other Ambulatory Visit (HOSPITAL_COMMUNITY): Payer: Self-pay | Admitting: Internal Medicine

## 2013-01-15 DIAGNOSIS — M79609 Pain in unspecified limb: Secondary | ICD-10-CM

## 2016-03-17 DIAGNOSIS — Z8546 Personal history of malignant neoplasm of prostate: Secondary | ICD-10-CM | POA: Diagnosis not present

## 2016-03-18 DIAGNOSIS — M4807 Spinal stenosis, lumbosacral region: Secondary | ICD-10-CM | POA: Diagnosis not present

## 2016-03-18 DIAGNOSIS — M4150 Other secondary scoliosis, site unspecified: Secondary | ICD-10-CM | POA: Diagnosis not present

## 2016-03-24 DIAGNOSIS — Z8546 Personal history of malignant neoplasm of prostate: Secondary | ICD-10-CM | POA: Diagnosis not present

## 2016-03-24 DIAGNOSIS — N5201 Erectile dysfunction due to arterial insufficiency: Secondary | ICD-10-CM | POA: Diagnosis not present

## 2016-03-24 DIAGNOSIS — N200 Calculus of kidney: Secondary | ICD-10-CM | POA: Diagnosis not present

## 2016-03-24 DIAGNOSIS — N393 Stress incontinence (female) (male): Secondary | ICD-10-CM | POA: Diagnosis not present

## 2016-04-06 DIAGNOSIS — E784 Other hyperlipidemia: Secondary | ICD-10-CM | POA: Diagnosis not present

## 2016-04-06 DIAGNOSIS — R7309 Other abnormal glucose: Secondary | ICD-10-CM | POA: Diagnosis not present

## 2016-04-06 DIAGNOSIS — Z125 Encounter for screening for malignant neoplasm of prostate: Secondary | ICD-10-CM | POA: Diagnosis not present

## 2016-04-13 DIAGNOSIS — G6289 Other specified polyneuropathies: Secondary | ICD-10-CM | POA: Diagnosis not present

## 2016-04-13 DIAGNOSIS — E784 Other hyperlipidemia: Secondary | ICD-10-CM | POA: Diagnosis not present

## 2016-04-13 DIAGNOSIS — J449 Chronic obstructive pulmonary disease, unspecified: Secondary | ICD-10-CM | POA: Diagnosis not present

## 2016-04-13 DIAGNOSIS — Z0000 Encounter for general adult medical examination without abnormal findings: Secondary | ICD-10-CM | POA: Diagnosis not present

## 2016-04-13 DIAGNOSIS — Z6824 Body mass index (BMI) 24.0-24.9, adult: Secondary | ICD-10-CM | POA: Diagnosis not present

## 2016-04-13 DIAGNOSIS — R001 Bradycardia, unspecified: Secondary | ICD-10-CM | POA: Diagnosis not present

## 2016-04-13 DIAGNOSIS — H6610 Chronic tubotympanic suppurative otitis media, unspecified: Secondary | ICD-10-CM | POA: Diagnosis not present

## 2016-04-13 DIAGNOSIS — M859 Disorder of bone density and structure, unspecified: Secondary | ICD-10-CM | POA: Diagnosis not present

## 2016-04-13 DIAGNOSIS — R7309 Other abnormal glucose: Secondary | ICD-10-CM | POA: Diagnosis not present

## 2016-04-13 DIAGNOSIS — Z1389 Encounter for screening for other disorder: Secondary | ICD-10-CM | POA: Diagnosis not present

## 2016-04-13 DIAGNOSIS — K627 Radiation proctitis: Secondary | ICD-10-CM | POA: Diagnosis not present

## 2016-04-13 DIAGNOSIS — R413 Other amnesia: Secondary | ICD-10-CM | POA: Diagnosis not present

## 2016-04-19 DIAGNOSIS — M5137 Other intervertebral disc degeneration, lumbosacral region: Secondary | ICD-10-CM | POA: Diagnosis not present

## 2016-04-19 DIAGNOSIS — M5136 Other intervertebral disc degeneration, lumbar region: Secondary | ICD-10-CM | POA: Diagnosis not present

## 2016-04-19 DIAGNOSIS — M9903 Segmental and somatic dysfunction of lumbar region: Secondary | ICD-10-CM | POA: Diagnosis not present

## 2016-04-19 DIAGNOSIS — M9902 Segmental and somatic dysfunction of thoracic region: Secondary | ICD-10-CM | POA: Diagnosis not present

## 2016-04-19 DIAGNOSIS — M9905 Segmental and somatic dysfunction of pelvic region: Secondary | ICD-10-CM | POA: Diagnosis not present

## 2016-04-19 DIAGNOSIS — S29012A Strain of muscle and tendon of back wall of thorax, initial encounter: Secondary | ICD-10-CM | POA: Diagnosis not present

## 2016-04-19 DIAGNOSIS — S39012A Strain of muscle, fascia and tendon of lower back, initial encounter: Secondary | ICD-10-CM | POA: Diagnosis not present

## 2016-04-19 DIAGNOSIS — M9904 Segmental and somatic dysfunction of sacral region: Secondary | ICD-10-CM | POA: Diagnosis not present

## 2016-04-22 DIAGNOSIS — M9905 Segmental and somatic dysfunction of pelvic region: Secondary | ICD-10-CM | POA: Diagnosis not present

## 2016-04-22 DIAGNOSIS — M9904 Segmental and somatic dysfunction of sacral region: Secondary | ICD-10-CM | POA: Diagnosis not present

## 2016-04-22 DIAGNOSIS — M9902 Segmental and somatic dysfunction of thoracic region: Secondary | ICD-10-CM | POA: Diagnosis not present

## 2016-04-22 DIAGNOSIS — S39012A Strain of muscle, fascia and tendon of lower back, initial encounter: Secondary | ICD-10-CM | POA: Diagnosis not present

## 2016-04-22 DIAGNOSIS — M5137 Other intervertebral disc degeneration, lumbosacral region: Secondary | ICD-10-CM | POA: Diagnosis not present

## 2016-04-22 DIAGNOSIS — S29012A Strain of muscle and tendon of back wall of thorax, initial encounter: Secondary | ICD-10-CM | POA: Diagnosis not present

## 2016-04-22 DIAGNOSIS — M9903 Segmental and somatic dysfunction of lumbar region: Secondary | ICD-10-CM | POA: Diagnosis not present

## 2016-04-22 DIAGNOSIS — M5136 Other intervertebral disc degeneration, lumbar region: Secondary | ICD-10-CM | POA: Diagnosis not present

## 2016-04-26 DIAGNOSIS — M9904 Segmental and somatic dysfunction of sacral region: Secondary | ICD-10-CM | POA: Diagnosis not present

## 2016-04-26 DIAGNOSIS — M9902 Segmental and somatic dysfunction of thoracic region: Secondary | ICD-10-CM | POA: Diagnosis not present

## 2016-04-26 DIAGNOSIS — S29012A Strain of muscle and tendon of back wall of thorax, initial encounter: Secondary | ICD-10-CM | POA: Diagnosis not present

## 2016-04-26 DIAGNOSIS — S39012A Strain of muscle, fascia and tendon of lower back, initial encounter: Secondary | ICD-10-CM | POA: Diagnosis not present

## 2016-04-26 DIAGNOSIS — M5136 Other intervertebral disc degeneration, lumbar region: Secondary | ICD-10-CM | POA: Diagnosis not present

## 2016-04-26 DIAGNOSIS — M5137 Other intervertebral disc degeneration, lumbosacral region: Secondary | ICD-10-CM | POA: Diagnosis not present

## 2016-04-26 DIAGNOSIS — M9905 Segmental and somatic dysfunction of pelvic region: Secondary | ICD-10-CM | POA: Diagnosis not present

## 2016-04-26 DIAGNOSIS — M9903 Segmental and somatic dysfunction of lumbar region: Secondary | ICD-10-CM | POA: Diagnosis not present

## 2016-04-27 DIAGNOSIS — Z1212 Encounter for screening for malignant neoplasm of rectum: Secondary | ICD-10-CM | POA: Diagnosis not present

## 2016-05-05 DIAGNOSIS — M9904 Segmental and somatic dysfunction of sacral region: Secondary | ICD-10-CM | POA: Diagnosis not present

## 2016-05-05 DIAGNOSIS — M5137 Other intervertebral disc degeneration, lumbosacral region: Secondary | ICD-10-CM | POA: Diagnosis not present

## 2016-05-05 DIAGNOSIS — M9903 Segmental and somatic dysfunction of lumbar region: Secondary | ICD-10-CM | POA: Diagnosis not present

## 2016-05-05 DIAGNOSIS — S39012A Strain of muscle, fascia and tendon of lower back, initial encounter: Secondary | ICD-10-CM | POA: Diagnosis not present

## 2016-05-05 DIAGNOSIS — M9905 Segmental and somatic dysfunction of pelvic region: Secondary | ICD-10-CM | POA: Diagnosis not present

## 2016-05-05 DIAGNOSIS — M5136 Other intervertebral disc degeneration, lumbar region: Secondary | ICD-10-CM | POA: Diagnosis not present

## 2016-05-05 DIAGNOSIS — S29012A Strain of muscle and tendon of back wall of thorax, initial encounter: Secondary | ICD-10-CM | POA: Diagnosis not present

## 2016-05-05 DIAGNOSIS — M9902 Segmental and somatic dysfunction of thoracic region: Secondary | ICD-10-CM | POA: Diagnosis not present

## 2016-05-06 DIAGNOSIS — H26492 Other secondary cataract, left eye: Secondary | ICD-10-CM | POA: Diagnosis not present

## 2016-05-06 DIAGNOSIS — H348312 Tributary (branch) retinal vein occlusion, right eye, stable: Secondary | ICD-10-CM | POA: Diagnosis not present

## 2016-05-06 DIAGNOSIS — H5201 Hypermetropia, right eye: Secondary | ICD-10-CM | POA: Diagnosis not present

## 2016-05-06 DIAGNOSIS — H353132 Nonexudative age-related macular degeneration, bilateral, intermediate dry stage: Secondary | ICD-10-CM | POA: Diagnosis not present

## 2016-05-06 DIAGNOSIS — H33312 Horseshoe tear of retina without detachment, left eye: Secondary | ICD-10-CM | POA: Diagnosis not present

## 2016-05-07 DIAGNOSIS — M9903 Segmental and somatic dysfunction of lumbar region: Secondary | ICD-10-CM | POA: Diagnosis not present

## 2016-05-07 DIAGNOSIS — M9904 Segmental and somatic dysfunction of sacral region: Secondary | ICD-10-CM | POA: Diagnosis not present

## 2016-05-07 DIAGNOSIS — M9905 Segmental and somatic dysfunction of pelvic region: Secondary | ICD-10-CM | POA: Diagnosis not present

## 2016-05-07 DIAGNOSIS — S29012A Strain of muscle and tendon of back wall of thorax, initial encounter: Secondary | ICD-10-CM | POA: Diagnosis not present

## 2016-05-07 DIAGNOSIS — S39012A Strain of muscle, fascia and tendon of lower back, initial encounter: Secondary | ICD-10-CM | POA: Diagnosis not present

## 2016-05-07 DIAGNOSIS — M5137 Other intervertebral disc degeneration, lumbosacral region: Secondary | ICD-10-CM | POA: Diagnosis not present

## 2016-05-07 DIAGNOSIS — M5136 Other intervertebral disc degeneration, lumbar region: Secondary | ICD-10-CM | POA: Diagnosis not present

## 2016-05-07 DIAGNOSIS — M9902 Segmental and somatic dysfunction of thoracic region: Secondary | ICD-10-CM | POA: Diagnosis not present

## 2016-05-10 DIAGNOSIS — M5136 Other intervertebral disc degeneration, lumbar region: Secondary | ICD-10-CM | POA: Diagnosis not present

## 2016-05-10 DIAGNOSIS — M9904 Segmental and somatic dysfunction of sacral region: Secondary | ICD-10-CM | POA: Diagnosis not present

## 2016-05-10 DIAGNOSIS — M9903 Segmental and somatic dysfunction of lumbar region: Secondary | ICD-10-CM | POA: Diagnosis not present

## 2016-05-10 DIAGNOSIS — S39012A Strain of muscle, fascia and tendon of lower back, initial encounter: Secondary | ICD-10-CM | POA: Diagnosis not present

## 2016-05-10 DIAGNOSIS — S29012A Strain of muscle and tendon of back wall of thorax, initial encounter: Secondary | ICD-10-CM | POA: Diagnosis not present

## 2016-05-10 DIAGNOSIS — M9905 Segmental and somatic dysfunction of pelvic region: Secondary | ICD-10-CM | POA: Diagnosis not present

## 2016-05-10 DIAGNOSIS — M9902 Segmental and somatic dysfunction of thoracic region: Secondary | ICD-10-CM | POA: Diagnosis not present

## 2016-05-10 DIAGNOSIS — M5137 Other intervertebral disc degeneration, lumbosacral region: Secondary | ICD-10-CM | POA: Diagnosis not present

## 2016-05-25 ENCOUNTER — Encounter: Payer: Self-pay | Admitting: Internal Medicine

## 2016-05-28 DIAGNOSIS — M5136 Other intervertebral disc degeneration, lumbar region: Secondary | ICD-10-CM | POA: Diagnosis not present

## 2016-05-28 DIAGNOSIS — M9902 Segmental and somatic dysfunction of thoracic region: Secondary | ICD-10-CM | POA: Diagnosis not present

## 2016-05-28 DIAGNOSIS — S29012A Strain of muscle and tendon of back wall of thorax, initial encounter: Secondary | ICD-10-CM | POA: Diagnosis not present

## 2016-05-28 DIAGNOSIS — S39012A Strain of muscle, fascia and tendon of lower back, initial encounter: Secondary | ICD-10-CM | POA: Diagnosis not present

## 2016-05-28 DIAGNOSIS — M5137 Other intervertebral disc degeneration, lumbosacral region: Secondary | ICD-10-CM | POA: Diagnosis not present

## 2016-05-28 DIAGNOSIS — M9905 Segmental and somatic dysfunction of pelvic region: Secondary | ICD-10-CM | POA: Diagnosis not present

## 2016-05-28 DIAGNOSIS — M9903 Segmental and somatic dysfunction of lumbar region: Secondary | ICD-10-CM | POA: Diagnosis not present

## 2016-05-28 DIAGNOSIS — M9904 Segmental and somatic dysfunction of sacral region: Secondary | ICD-10-CM | POA: Diagnosis not present

## 2016-06-18 DIAGNOSIS — M5137 Other intervertebral disc degeneration, lumbosacral region: Secondary | ICD-10-CM | POA: Diagnosis not present

## 2016-06-18 DIAGNOSIS — M9905 Segmental and somatic dysfunction of pelvic region: Secondary | ICD-10-CM | POA: Diagnosis not present

## 2016-06-18 DIAGNOSIS — M9904 Segmental and somatic dysfunction of sacral region: Secondary | ICD-10-CM | POA: Diagnosis not present

## 2016-06-18 DIAGNOSIS — M9903 Segmental and somatic dysfunction of lumbar region: Secondary | ICD-10-CM | POA: Diagnosis not present

## 2016-06-18 DIAGNOSIS — S39012A Strain of muscle, fascia and tendon of lower back, initial encounter: Secondary | ICD-10-CM | POA: Diagnosis not present

## 2016-06-18 DIAGNOSIS — S29012A Strain of muscle and tendon of back wall of thorax, initial encounter: Secondary | ICD-10-CM | POA: Diagnosis not present

## 2016-06-18 DIAGNOSIS — M9902 Segmental and somatic dysfunction of thoracic region: Secondary | ICD-10-CM | POA: Diagnosis not present

## 2016-06-18 DIAGNOSIS — M5136 Other intervertebral disc degeneration, lumbar region: Secondary | ICD-10-CM | POA: Diagnosis not present

## 2016-06-22 ENCOUNTER — Encounter: Payer: Self-pay | Admitting: Internal Medicine

## 2016-08-17 ENCOUNTER — Encounter: Payer: Self-pay | Admitting: Internal Medicine

## 2016-08-17 ENCOUNTER — Ambulatory Visit (INDEPENDENT_AMBULATORY_CARE_PROVIDER_SITE_OTHER): Payer: PPO | Admitting: Internal Medicine

## 2016-08-17 VITALS — BP 120/70 | HR 60 | Ht 69.0 in | Wt 157.4 lb

## 2016-08-17 DIAGNOSIS — K219 Gastro-esophageal reflux disease without esophagitis: Secondary | ICD-10-CM

## 2016-08-17 DIAGNOSIS — R1310 Dysphagia, unspecified: Secondary | ICD-10-CM | POA: Diagnosis not present

## 2016-08-17 DIAGNOSIS — K222 Esophageal obstruction: Secondary | ICD-10-CM

## 2016-08-17 DIAGNOSIS — R1319 Other dysphagia: Secondary | ICD-10-CM

## 2016-08-17 MED ORDER — PANTOPRAZOLE SODIUM 40 MG PO TBEC: 40.0000 mg | DELAYED_RELEASE_TABLET | Freq: Every day | ORAL | 3 refills | Status: DC

## 2016-08-17 NOTE — Progress Notes (Signed)
Dennis Reynolds 83 y.o. 08/27/1931 [id number]  Assessment & Plan:   Encounter Diagnoses  Name Primary?  . GERD with stricture Yes  . Esophageal dysphagia     Restart Pantoprazole 40 mg q day  D/C alendronate for now to reduce risk of ulcers  Schedule EGD with dilation   At this time because of the history Dennis Reynolds had with prior successful esophageal dilation we will go ahead and schedule another one on August 28th. He most likely has another stricture.  If by chance his symptoms fully resolve with restarting the pantoprazole then he may cancel his EGD.  I have personally seen the patient, reviewed and repeated key elements of the history and physical and participated in formation of the assessment and plan the student has documented.  The risks and benefits as well as alternatives of endoscopic procedure(s) have been discussed and reviewed. All questions answered. The patient agrees to proceed.  Gatha Mayer, MD, [facility]    Subjective:   Chief Complaint: Difficulty swallowing pills and food  HPI Dennis Reynolds, known to the practice has a past medical history of GERD, osteopenia, COPD as well as others listed below.  He is here today for evaluation of new onset dysphagia with pills and solid foods that has gradually been worsening over the past 6 months.  Previously in 2013, he had a stricture found near his GE junction and had successful dilation by Dr. Carlean Purl.    Today he reports symptoms of pill dysphagia and solid food dysphagia. The dysphagia is intermittent and worse in the mornings.  He denies any trouble with liquids.  Often he has to drink warm water to wash his pills down when they get stuck in the bottom of his esophagus. After he eats, he says he has episodes of needing to vomit to relieve epigastric pressure.  He denies weight loss, fatigue, hematemesis, change is appetite, globus sensation.  No change in bowel habits.  No Known Allergies Current Meds    Medication Sig  . alendronate (FOSAMAX) 70 MG tablet Take 70 mg by mouth once a week. Take with a full glass of water on an empty stomach.  Marland Kitchen aspirin 81 MG tablet Take 81 mg by mouth daily.  Marland Kitchen CALCIUM-VITAMIN D PO Take 1 tablet by mouth daily.  . Flaxseed, Linseed, (FLAX SEED OIL) 1000 MG CAPS Take 1 capsule by mouth daily.  . Multiple Vitamin (MULTIVITAMIN) capsule Take 1 capsule by mouth daily.  . vitamin E 200 UNIT capsule Take 400 Units by mouth daily.   Past Medical History:  Diagnosis Date  . Arthritis   . BPH (benign prostatic hyperplasia)   . COPD (chronic obstructive pulmonary disease) ([place])   . Diverticulosis   . Duodenal ulcer due to Helicobacter pylori   . ED (erectile dysfunction)   . GERD (gastroesophageal reflux disease)   . Hyperlipidemia   . Internal hemorrhoid   . Neuropathy    LLE  . Osteopenia   . Otitis media    chronic  . Prostate cancer ([place]) 2003  . Radiation proctitis   . Retinal tear    x 2  . Sinus bradycardia   . Stricture and stenosis of esophagus   . Vitamin D deficiency    Past Surgical History:  Procedure Laterality Date  . COLONOSCOPY     multiple  . ESOPHAGOGASTRODUODENOSCOPY    . [place]   right  . INNER EAR SURGERY  right  . OTHER SURGICAL HISTORY     radiation for prostate  . PROSTATECTOMY    . RETINAL DETACHMENT SURGERY     x 2, left  . TONSILLECTOMY AND ADENOIDECTOMY    . VASECTOMY     Social History   Social History  . Marital status: Married    Spouse name: [name]  . Number of children: 2  . Years of education: N/A   Occupational History  . retired Airline pilot    Social History Main Topics  . Smoking status: Never Smoker  . Smokeless tobacco: Never Used  . Alcohol use No  . Drug use: No  . Sexual activity: Not on file   Other Topics Concern  . Not on file   Social History Narrative  . No narrative on file   family history includes Colon cancer in his sister; Diabetes in his  sister; Heart disease in his mother; Parkinsonism in his father; Prostate cancer in his father.   Review of Systems GI: positive for pill and food dysphagia, vomiting. Negative for weight loss, change in appetite  All other systems negative See HPI for details   Objective:   Physical Exam @BP  120/70   Pulse 60   Ht 5\' 9"  (1.753 m)   Wt 157 lb 6.4 oz (71.4 kg)   BMI 23.24 kg/m @  General:  Well-developed, well-nourished and in no acute distress Eyes:  anicteric. ENT:   Mouth and posterior pharynx free of lesions.  Neck:   supple w/o thyromegaly or mass.  Lungs: Clear to auscultation bilaterally. Heart:   S1S2, no rubs, murmurs, gallops. Abdomen:  soft, non-tender, no hepatosplenomegaly, hernia, or mass and BS+.  Rectal: Not Examined Lymph:  no cervical or supraclavicular adenopathy. Extremities:   no edema, cyanosis or clubbing Skin   no rash. Neuro:  A&O x 3.  Psych:  appropriate mood and  Affect.   Data Reviewed: EGD by Carlean Purl 2013 PCP notes  Edward Qualia, PA-S [facility]

## 2016-08-17 NOTE — Patient Instructions (Signed)
  You have been scheduled for an endoscopy. Please follow written instructions given to you at your visit today. If you use inhalers (even only as needed), please bring them with you on the day of your procedure.   We have sent the following medications to your pharmacy for you to pick up at your convenience: Protonix  Hold Fosamax until further notice per Dr Carlean Purl.    I appreciate the opportunity to care for you. Silvano Rusk, MD, [facility]

## 2016-08-18 ENCOUNTER — Encounter: Payer: Self-pay | Admitting: Internal Medicine

## 2016-08-27 ENCOUNTER — Telehealth: Payer: Self-pay

## 2016-08-27 NOTE — Telephone Encounter (Signed)
Spoke with his wife Gwyndolyn Saxon and we made him a October 29th 2018 at 10:45AM appointment.

## 2016-08-27 NOTE — Telephone Encounter (Signed)
Mr Dennis Reynolds called back and said he is doing "Fantastic" , no problems that is why he said he cancelled his EGD.

## 2016-08-27 NOTE — Telephone Encounter (Signed)
Left message on both home and cell #'s to call and update [place] on how he's doing.  He canceled his upcoming EGD/Dil to see if the medicine ( Protonix) will help him.

## 2016-08-27 NOTE — Telephone Encounter (Signed)
Ok ask him to make a next available f/u

## 2016-08-31 ENCOUNTER — Encounter: Payer: PPO | Admitting: Internal Medicine

## 2016-09-20 ENCOUNTER — Telehealth: Payer: Self-pay

## 2016-09-21 NOTE — Telephone Encounter (Signed)
ERROR

## 2016-10-07 DIAGNOSIS — M4807 Spinal stenosis, lumbosacral region: Secondary | ICD-10-CM | POA: Diagnosis not present

## 2016-10-07 DIAGNOSIS — M4150 Other secondary scoliosis, site unspecified: Secondary | ICD-10-CM | POA: Diagnosis not present

## 2016-10-28 DIAGNOSIS — D485 Neoplasm of uncertain behavior of skin: Secondary | ICD-10-CM | POA: Diagnosis not present

## 2016-10-28 DIAGNOSIS — L570 Actinic keratosis: Secondary | ICD-10-CM | POA: Diagnosis not present

## 2016-10-28 DIAGNOSIS — D1801 Hemangioma of skin and subcutaneous tissue: Secondary | ICD-10-CM | POA: Diagnosis not present

## 2016-10-28 DIAGNOSIS — L821 Other seborrheic keratosis: Secondary | ICD-10-CM | POA: Diagnosis not present

## 2016-10-28 DIAGNOSIS — L814 Other melanin hyperpigmentation: Secondary | ICD-10-CM | POA: Diagnosis not present

## 2016-10-28 DIAGNOSIS — L281 Prurigo nodularis: Secondary | ICD-10-CM | POA: Diagnosis not present

## 2016-11-01 ENCOUNTER — Ambulatory Visit (INDEPENDENT_AMBULATORY_CARE_PROVIDER_SITE_OTHER): Payer: PPO | Admitting: Internal Medicine

## 2016-11-01 ENCOUNTER — Encounter (INDEPENDENT_AMBULATORY_CARE_PROVIDER_SITE_OTHER): Payer: Self-pay

## 2016-11-01 ENCOUNTER — Encounter: Payer: Self-pay | Admitting: Internal Medicine

## 2016-11-01 VITALS — BP 128/60 | HR 68 | Ht 68.25 in | Wt 155.0 lb

## 2016-11-01 DIAGNOSIS — R630 Anorexia: Secondary | ICD-10-CM

## 2016-11-01 DIAGNOSIS — R198 Other specified symptoms and signs involving the digestive system and abdomen: Secondary | ICD-10-CM

## 2016-11-01 DIAGNOSIS — R1310 Dysphagia, unspecified: Secondary | ICD-10-CM

## 2016-11-01 DIAGNOSIS — R634 Abnormal weight loss: Secondary | ICD-10-CM

## 2016-11-01 DIAGNOSIS — R1319 Other dysphagia: Secondary | ICD-10-CM

## 2016-11-01 NOTE — Progress Notes (Signed)
Dennis Reynolds 83 y.o. July 28, 1931 [id number]  Assessment & Plan:   Encounter Diagnoses  Name Primary?  . Esophageal dysphagia Yes  . Loss of weight   . Change in bowel movement     EGD + dili The risks and benefits as well as alternatives of endoscopic procedure(s) have been discussed and reviewed. All questions answered. The patient agrees to proceed.  Suspect IBS causing BM changes   May need Ct abd/pelvis pending above  I appreciate the opportunity to care for this patient. CC: Shon Baton, MD  Subjective:   Chief Complaint: Dysphagia weight loss  HPI Had seen the patient in August and recommended an EGD with dilation due to intermittent dysphagia to solid foods.  Subsequently he has lost weight he thought maybe 10 pounds but we have him down about 5 pounds since June.  In the mornings he is not hungry.  He is better on PPI but not remembering to take it every day.  Stools are floating and breaking up though not diarrhea.  He is wondering if he needs a colonoscopy.  Colonoscopy in 2013 notable for diverticulosis, internal hemorrhoids and some mild radiation proctitis.  Terminal ileum was normal as well.  Wt Readings from Last 3 Encounters:  11/01/16 155 lb (70.3 kg)  08/17/16 157 lb 6.4 oz (71.4 kg)  07/02/11 160 lb (72.6 kg)     No Known Allergies Current Meds  Medication Sig  . alendronate (FOSAMAX) 70 MG tablet Take 70 mg by mouth once a week. Take with a full glass of water on an empty stomach.  Marland Kitchen aspirin 81 MG tablet Take 81 mg by mouth daily.  Marland Kitchen CALCIUM-VITAMIN D PO Take 1 tablet by mouth daily.  . Flaxseed, Linseed, (FLAX SEED OIL) 1000 MG CAPS Take 1 capsule by mouth daily.  . Multiple Vitamin (MULTIVITAMIN) capsule Take 1 capsule by mouth daily.  . pantoprazole (PROTONIX) 40 MG tablet Take 1 tablet (40 mg total) by mouth daily before breakfast. Take 30-60 minutes before breakfast  . vitamin E 200 UNIT capsule Take 400 Units by mouth daily.   Past  Medical History:  Diagnosis Date  . Arthritis   . BPH (benign prostatic hyperplasia)   . COPD (chronic obstructive pulmonary disease) ([place])   . Diverticulosis   . Duodenal ulcer due to Helicobacter pylori   . ED (erectile dysfunction)   . GERD (gastroesophageal reflux disease)   . Hyperlipidemia   . Internal hemorrhoid   . Neuropathy    LLE  . Osteopenia   . Otitis media    chronic  . Prostate cancer ([place]) 2003  . Radiation proctitis   . Retinal tear    x 2  . Sinus bradycardia   . Stricture and stenosis of esophagus   . Vitamin D deficiency    Past Surgical History:  Procedure Laterality Date  . COLONOSCOPY     multiple  . ESOPHAGOGASTRODUODENOSCOPY    . [place]   right  . INNER EAR SURGERY     right  . OTHER SURGICAL HISTORY     radiation for prostate  . PROSTATECTOMY    . RETINAL DETACHMENT SURGERY     x 2, left  . TONSILLECTOMY AND ADENOIDECTOMY    . VASECTOMY     Social History   Social History  . Marital status: Married    Spouse name: [name]  . Number of children: 2  . Years of education: N/A   Occupational  History  . retired Airline pilot    Social History Main Topics  . Smoking status: Never Smoker  . Smokeless tobacco: Never Used  . Alcohol use No  . Drug use: No   family history includes Colon cancer in his sister; Diabetes in his sister; Heart disease in his mother; Parkinsonism in his father; Prostate cancer in his father.   Review of Systems  As per HPI Objective:   Physical Exam @BP  128/60 (BP Location: Left Arm, Patient Position: Sitting, Cuff Size: Normal)   Pulse 68   Ht 5' 8.25" (1.734 m) Comment: height measured without shoes  Wt 155 lb (70.3 kg)   BMI 23.40 kg/m @  General:  NAD Eyes:   anicteric Lungs:  clear Heart::  S1S2 no rubs, murmurs or gallops Abdomen:  soft and nontender, BS+ Ext:   no edema, cyanosis or clubbing    Data Reviewed:   See HPI

## 2016-11-01 NOTE — Patient Instructions (Addendum)
  You have been scheduled for an endoscopy. Please follow written instructions given to you at your visit today. If you use inhalers (even only as needed), please bring them with you on the day of your procedure.   I appreciate the opportunity to care for you. Carl Gessner, MD, FACG 

## 2016-11-02 ENCOUNTER — Ambulatory Visit (AMBULATORY_SURGERY_CENTER): Payer: PPO | Admitting: Internal Medicine

## 2016-11-02 ENCOUNTER — Encounter: Payer: Self-pay | Admitting: Internal Medicine

## 2016-11-02 ENCOUNTER — Other Ambulatory Visit: Payer: PPO

## 2016-11-02 VITALS — BP 122/75 | HR 58 | Temp 97.8°F | Resp 12 | Ht 68.5 in | Wt 155.0 lb

## 2016-11-02 DIAGNOSIS — K222 Esophageal obstruction: Secondary | ICD-10-CM

## 2016-11-02 DIAGNOSIS — K2980 Duodenitis without bleeding: Secondary | ICD-10-CM

## 2016-11-02 DIAGNOSIS — R1310 Dysphagia, unspecified: Secondary | ICD-10-CM | POA: Diagnosis not present

## 2016-11-02 MED ORDER — SODIUM CHLORIDE 0.9 % IV SOLN: 500.0000 mL | INTRAVENOUS | Status: DC

## 2016-11-02 NOTE — Progress Notes (Signed)
Pt's states no medical or surgical changes since previsit or office visit. 

## 2016-11-02 NOTE — Patient Instructions (Addendum)
There was a stricture where esophagus and stomach meet (again). I dilated it. Mild inflammation in duodenum - top of intestine.  PLEASE MAKE SURE TO TAKE THE PANTOPRAZOLE EVERY DAY.  We will get you an appointment to see me in about 1 month - my staff will need to arrange this and will call you. If you do not hear back by next week please call us.  You also need a blood test called a gastrin level. You will go to lab today to have that done.  I appreciate the opportunity to care for you. Gatha Mayer, MD, FACG  YOU HAD AN ENDOSCOPIC PROCEDURE TODAY AT [facility]:   Refer to the procedure report that was given to you for any specific questions about what was found during the examination.  If the procedure report does not answer your questions, please call your gastroenterologist to clarify.  If you requested that your care partner not be given the details of your procedure findings, then the procedure report has been included in a sealed envelope for you to review at your convenience later.  YOU SHOULD EXPECT: Some feelings of bloating in the abdomen. Passage of more gas than usual.  Walking can help get rid of the air that was put into your GI tract during the procedure and reduce the bloating. If you had a lower endoscopy (such as a colonoscopy or flexible sigmoidoscopy) you may notice spotting of blood in your stool or on the toilet paper. If you underwent a bowel prep for your procedure, you may not have a normal bowel movement for a few days.  Please Note:  You might notice some irritation and congestion in your nose or some drainage.  This is from the oxygen used during your procedure.  There is no need for concern and it should clear up in a day or so.  SYMPTOMS TO REPORT IMMEDIATELY:   Following upper endoscopy (EGD)  Vomiting of blood or coffee ground material  New chest pain or pain under the shoulder blades  Painful or persistently difficult  swallowing  New shortness of breath  Fever of 100F or higher  Black, tarry-looking stools  For urgent or emergent issues, a gastroenterologist can be reached at any hour by calling [phone].   DIET:  We do recommend a small meal at first, but then you may proceed to your regular diet.  Drink plenty of fluids but you should avoid alcoholic beverages for 24 hours.  ACTIVITY:  You should plan to take it easy for the rest of today and you should NOT DRIVE or use heavy machinery until tomorrow (because of the sedation medicines used during the test).    FOLLOW UP: Our staff will call the number listed on your records the next business day following your procedure to check on you and address any questions or concerns that you may have regarding the information given to you following your procedure. If we do not reach you, we will leave a message.  However, if you are feeling well and you are not experiencing any problems, there is no need to return our call.  We will assume that you have returned to your regular daily activities without incident.  If any biopsies were taken you will be contacted by phone or by letter within the next 1-3 weeks.  Please call us at [phone] if you have not heard about the biopsies in 3 weeks.    SIGNATURES/CONFIDENTIALITY: You and/or  your care partner have signed paperwork which will be entered into your electronic medical record.  These signatures attest to the fact that that the information above on your After Visit Summary has been reviewed and is understood.  Full responsibility of the confidentiality of this discharge information lies with you and/or your care-partner.

## 2016-11-02 NOTE — Op Note (Signed)
[facility] Patient Name: Dennis Reynolds Procedure Date: 11/02/2016 10:33 AM MRN: [id number] Endoscopist: Gatha Mayer , MD Age: 83 Referring MD:  Date of Birth: 06-Apr-1931 Gender: Male Account #: [id number] Procedure:                Upper GI endoscopy Indications:              Dysphagia Medicines:                Propofol per Anesthesia, Monitored Anesthesia Care Procedure:                Pre-Anesthesia Assessment:                           - Prior to the procedure, a History and Physical                            was performed, and patient medications and                            allergies were reviewed. The patient's tolerance of                            previous anesthesia was also reviewed. The risks                            and benefits of the procedure and the sedation                            options and risks were discussed with the patient.                            All questions were answered, and informed consent                            was obtained. Prior Anticoagulants: The patient has                            taken no previous anticoagulant or antiplatelet                            agents. ASA Grade Assessment: II - A patient with                            mild systemic disease. After reviewing the risks                            and benefits, the patient was deemed in                            satisfactory condition to undergo the procedure.                           After obtaining informed consent, the endoscope was  passed under direct vision. Throughout the                            procedure, the patient's blood pressure, pulse, and                            oxygen saturations were monitored continuously. The                            Endoscope was introduced through the mouth, and                            advanced to the second part of duodenum. The upper                            GI endoscopy was  accomplished without difficulty.                            The patient tolerated the procedure well. Scope In: Scope Out: Findings:                 One moderate benign-appearing, intrinsic stenosis                            was found at the gastroesophageal junction. This                            measured 1.4 cm (inner diameter) and was traversed.                            A TTS dilator was passed through the scope.                            Dilation with a 16-17-18 mm balloon dilator was                            performed to 18 mm. The dilation site was examined                            and showed moderate improvement in luminal                            narrowing. Estimated blood loss was minimal.                           Mild inflammation characterized by congestion                            (edema), erythema and granularity was found in the                            duodenal bulb.                           The exam  was otherwise without abnormality.                           The cardia and gastric fundus were normal on                            retroflexion. Complications:            No immediate complications. Estimated Blood Loss:     Estimated blood loss was minimal. Impression:               - Benign-appearing esophageal stenosis. Dilated.                            Same appearance as in 2013                           - Duodenitis.                           - The examination was otherwise normal.                           - No specimens collected. Recommendation:           - Patient has a contact number available for                            emergencies. The signs and symptoms of potential                            delayed complications were discussed with the                            patient. Return to normal activities tomorrow.                            Written discharge instructions were provided to the                            patient.                            - Clear liquids x 1 hour then soft foods rest of                            day. Start prior diet tomorrow.                           - Continue present medications. be sure to take                            pantoprazole daily - has not been doing so                           - Follow an antireflux regimen.                           -  Check gastrin level today                           consider stool testing for H pylori at a later date                            (has hx tx in 2013)                           NEEDS OFFICE VISIT ME IN NOVEMBER - MID TO LATE -                            [place] WILL WORK IN Gatha Mayer, MD 11/02/2016 11:07:58 AM This report has been signed electronically.

## 2016-11-02 NOTE — Progress Notes (Signed)
Called to room to assist during endoscopic procedure.  Patient ID and intended procedure confirmed with present staff. Received instructions for my participation in the procedure from the performing physician.  

## 2016-11-02 NOTE — Progress Notes (Signed)
Spontaneous respirations throughout. VSS. Resting comfortably. To PACU on room air. Report to  RN. 

## 2016-11-03 ENCOUNTER — Telehealth: Payer: Self-pay | Admitting: *Deleted

## 2016-11-03 NOTE — Telephone Encounter (Signed)
  Follow up Call-  Call back number 11/02/2016  Post procedure Call Back phone  # [phone]  Permission to leave phone message Yes  Some recent data might be hidden     Patient questions:  Do you have a fever, pain , or abdominal swelling? No. Pain Score  0 *  Have you tolerated food without any problems? Yes.    Have you been able to return to your normal activities? Yes.    Do you have any questions about your discharge instructions: Diet   No. Medications  No. Follow up visit  No.  Do you have questions or concerns about your Care? No.  Actions: * If pain score is 4 or above: No action needed, pain <4.

## 2016-11-05 LAB — GASTRIN

## 2016-11-08 NOTE — Progress Notes (Signed)
Let him know this blood test is ok - not making too much acid  Please call him and also work him in this month (later)

## 2016-12-03 ENCOUNTER — Ambulatory Visit: Payer: PPO | Admitting: Internal Medicine

## 2017-01-25 DIAGNOSIS — H353132 Nonexudative age-related macular degeneration, bilateral, intermediate dry stage: Secondary | ICD-10-CM | POA: Diagnosis not present

## 2017-01-25 DIAGNOSIS — H43393 Other vitreous opacities, bilateral: Secondary | ICD-10-CM | POA: Diagnosis not present

## 2017-01-25 DIAGNOSIS — H43813 Vitreous degeneration, bilateral: Secondary | ICD-10-CM | POA: Diagnosis not present

## 2017-01-25 DIAGNOSIS — H35373 Puckering of macula, bilateral: Secondary | ICD-10-CM | POA: Diagnosis not present

## 2017-01-28 ENCOUNTER — Encounter (INDEPENDENT_AMBULATORY_CARE_PROVIDER_SITE_OTHER): Payer: Self-pay

## 2017-01-28 ENCOUNTER — Ambulatory Visit: Payer: PPO | Admitting: Internal Medicine

## 2017-01-28 ENCOUNTER — Encounter: Payer: Self-pay | Admitting: Internal Medicine

## 2017-01-28 VITALS — BP 110/60 | HR 60 | Ht 68.25 in | Wt 164.0 lb

## 2017-01-28 DIAGNOSIS — K219 Gastro-esophageal reflux disease without esophagitis: Secondary | ICD-10-CM

## 2017-01-28 DIAGNOSIS — K222 Esophageal obstruction: Secondary | ICD-10-CM

## 2017-01-28 NOTE — Progress Notes (Signed)
   Dennis Reynolds [place] 84 y.o. 1931/05/18 [id number]  Assessment & Plan:   Encounter Diagnosis  Name Primary?  . GERD with stricture Yes    Doing well after dilation to 18 mm and on PPI To stay on PPI Reviewed why to stay on PPI and also that associations such as dementia, fractures and renal failure are weak at best and he need PPI  Can get refills from PCP  I appreciate the opportunity to care for him  WC:BJSEG, Jenny Reichmann, MD    Subjective:   Chief Complaint: f/u dysphagia and GERD  HPI No dysphagia or heartburn after EGD dilation to 18 mm and taking PPI Wt Readings from Last 3 Encounters:  01/28/17 164 lb (74.4 kg)  11/02/16 155 lb (70.3 kg)  11/01/16 155 lb (70.3 kg)    No Known Allergies Current Meds  Medication Sig  . alendronate (FOSAMAX) 70 MG tablet Take 70 mg by mouth once a week. Take with a full glass of water on an empty stomach.  Marland Kitchen aspirin 81 MG tablet Take 81 mg by mouth daily.  Marland Kitchen CALCIUM-VITAMIN D PO Take 1 tablet by mouth daily.  . Flaxseed, Linseed, (FLAX SEED OIL) 1000 MG CAPS Take 1 capsule by mouth daily.  . Multiple Vitamin (MULTIVITAMIN) capsule Take 1 capsule by mouth daily.  . pantoprazole (PROTONIX) 40 MG tablet Take 1 tablet (40 mg total) by mouth daily before breakfast. Take 30-60 minutes before breakfast  . vitamin E 200 UNIT capsule Take 400 Units by mouth daily.   Past Medical History:  Diagnosis Date  . Arthritis   . BPH (benign prostatic hyperplasia)   . COPD (chronic obstructive pulmonary disease) ([place])   . Diverticulosis   . Duodenal ulcer due to Helicobacter pylori   . ED (erectile dysfunction)   . GERD (gastroesophageal reflux disease)   . Hyperlipidemia   . Internal hemorrhoid   . Neuropathy    LLE  . Osteopenia   . Otitis media    chronic  . Prostate cancer ([place]) 2003  . Radiation proctitis   . Retinal tear    x 2  . Sinus bradycardia   . Stricture and stenosis of esophagus   . Vitamin D deficiency    Past  Surgical History:  Procedure Laterality Date  . COLONOSCOPY     multiple  . ESOPHAGOGASTRODUODENOSCOPY    . [place]   right  . INNER EAR SURGERY     right  . OTHER SURGICAL HISTORY     radiation for prostate  . PROSTATECTOMY    . RETINAL DETACHMENT SURGERY     x 2, left  . TONSILLECTOMY AND ADENOIDECTOMY    . VASECTOMY     Social History   Married family history includes Colon cancer in his sister; Diabetes in his sister; Heart disease in his mother; Parkinsonism in his father; Prostate cancer in his father.   Review of Systems As above Says stays busy - always on the go w/ wife and family  Objective:   Physical Exam BP 110/60   Pulse 60   Ht 5' 8.25" (1.734 m)   Wt 164 lb (74.4 kg)   BMI 24.75 kg/m   NAD  15 minutes time spent with patient > half in counseling coordination of care

## 2017-01-28 NOTE — Patient Instructions (Addendum)
Please stay on your pantoprazole per Dr Carlean Purl and you may get refills thru Dr Virgina Jock.  Follow up as needed with Dr Carlean Purl.    I appreciate the opportunity to care for you. Silvano Rusk, MD, [facility]

## 2017-03-15 DIAGNOSIS — H35373 Puckering of macula, bilateral: Secondary | ICD-10-CM | POA: Diagnosis not present

## 2017-03-15 DIAGNOSIS — H31011 Macula scars of posterior pole (postinflammatory) (post-traumatic), right eye: Secondary | ICD-10-CM | POA: Diagnosis not present

## 2017-03-15 DIAGNOSIS — H348311 Tributary (branch) retinal vein occlusion, right eye, with retinal neovascularization: Secondary | ICD-10-CM | POA: Diagnosis not present

## 2017-03-15 DIAGNOSIS — H353132 Nonexudative age-related macular degeneration, bilateral, intermediate dry stage: Secondary | ICD-10-CM | POA: Diagnosis not present

## 2017-03-23 DIAGNOSIS — Z8546 Personal history of malignant neoplasm of prostate: Secondary | ICD-10-CM | POA: Diagnosis not present

## 2017-03-30 DIAGNOSIS — N393 Stress incontinence (female) (male): Secondary | ICD-10-CM | POA: Diagnosis not present

## 2017-03-30 DIAGNOSIS — N200 Calculus of kidney: Secondary | ICD-10-CM | POA: Diagnosis not present

## 2017-03-30 DIAGNOSIS — N5201 Erectile dysfunction due to arterial insufficiency: Secondary | ICD-10-CM | POA: Diagnosis not present

## 2017-03-30 DIAGNOSIS — Z8546 Personal history of malignant neoplasm of prostate: Secondary | ICD-10-CM | POA: Diagnosis not present

## 2017-04-05 DIAGNOSIS — H348311 Tributary (branch) retinal vein occlusion, right eye, with retinal neovascularization: Secondary | ICD-10-CM | POA: Diagnosis not present

## 2017-04-07 DIAGNOSIS — M859 Disorder of bone density and structure, unspecified: Secondary | ICD-10-CM | POA: Diagnosis not present

## 2017-04-07 DIAGNOSIS — R7309 Other abnormal glucose: Secondary | ICD-10-CM | POA: Diagnosis not present

## 2017-04-07 DIAGNOSIS — E7849 Other hyperlipidemia: Secondary | ICD-10-CM | POA: Diagnosis not present

## 2017-04-07 DIAGNOSIS — Z125 Encounter for screening for malignant neoplasm of prostate: Secondary | ICD-10-CM | POA: Diagnosis not present

## 2017-04-07 DIAGNOSIS — R82998 Other abnormal findings in urine: Secondary | ICD-10-CM | POA: Diagnosis not present

## 2017-04-14 DIAGNOSIS — G6289 Other specified polyneuropathies: Secondary | ICD-10-CM | POA: Diagnosis not present

## 2017-04-14 DIAGNOSIS — K627 Radiation proctitis: Secondary | ICD-10-CM | POA: Diagnosis not present

## 2017-04-14 DIAGNOSIS — E785 Hyperlipidemia, unspecified: Secondary | ICD-10-CM | POA: Diagnosis not present

## 2017-04-14 DIAGNOSIS — Z6824 Body mass index (BMI) 24.0-24.9, adult: Secondary | ICD-10-CM | POA: Diagnosis not present

## 2017-04-14 DIAGNOSIS — R413 Other amnesia: Secondary | ICD-10-CM | POA: Diagnosis not present

## 2017-04-14 DIAGNOSIS — R001 Bradycardia, unspecified: Secondary | ICD-10-CM | POA: Diagnosis not present

## 2017-04-14 DIAGNOSIS — D692 Other nonthrombocytopenic purpura: Secondary | ICD-10-CM | POA: Diagnosis not present

## 2017-04-14 DIAGNOSIS — Z1389 Encounter for screening for other disorder: Secondary | ICD-10-CM | POA: Diagnosis not present

## 2017-04-14 DIAGNOSIS — Z0000 Encounter for general adult medical examination without abnormal findings: Secondary | ICD-10-CM | POA: Diagnosis not present

## 2017-04-14 DIAGNOSIS — R1310 Dysphagia, unspecified: Secondary | ICD-10-CM | POA: Diagnosis not present

## 2017-04-14 DIAGNOSIS — R7309 Other abnormal glucose: Secondary | ICD-10-CM | POA: Diagnosis not present

## 2017-04-14 DIAGNOSIS — H6610 Chronic tubotympanic suppurative otitis media, unspecified: Secondary | ICD-10-CM | POA: Diagnosis not present

## 2017-04-14 DIAGNOSIS — Z119 Encounter for screening for infectious and parasitic diseases, unspecified: Secondary | ICD-10-CM | POA: Diagnosis not present

## 2017-04-18 DIAGNOSIS — Z1212 Encounter for screening for malignant neoplasm of rectum: Secondary | ICD-10-CM | POA: Diagnosis not present

## 2017-05-16 DIAGNOSIS — Z961 Presence of intraocular lens: Secondary | ICD-10-CM | POA: Diagnosis not present

## 2017-05-16 DIAGNOSIS — H353132 Nonexudative age-related macular degeneration, bilateral, intermediate dry stage: Secondary | ICD-10-CM | POA: Diagnosis not present

## 2017-05-16 DIAGNOSIS — H5212 Myopia, left eye: Secondary | ICD-10-CM | POA: Diagnosis not present

## 2017-05-16 DIAGNOSIS — H5201 Hypermetropia, right eye: Secondary | ICD-10-CM | POA: Diagnosis not present

## 2017-05-16 DIAGNOSIS — H33312 Horseshoe tear of retina without detachment, left eye: Secondary | ICD-10-CM | POA: Diagnosis not present

## 2017-05-16 DIAGNOSIS — H348312 Tributary (branch) retinal vein occlusion, right eye, stable: Secondary | ICD-10-CM | POA: Diagnosis not present

## 2017-05-16 DIAGNOSIS — H52222 Regular astigmatism, left eye: Secondary | ICD-10-CM | POA: Diagnosis not present

## 2017-07-12 DIAGNOSIS — H348312 Tributary (branch) retinal vein occlusion, right eye, stable: Secondary | ICD-10-CM | POA: Diagnosis not present

## 2017-07-12 DIAGNOSIS — H353132 Nonexudative age-related macular degeneration, bilateral, intermediate dry stage: Secondary | ICD-10-CM | POA: Diagnosis not present

## 2017-07-12 DIAGNOSIS — H31011 Macula scars of posterior pole (postinflammatory) (post-traumatic), right eye: Secondary | ICD-10-CM | POA: Diagnosis not present

## 2017-07-12 DIAGNOSIS — H35373 Puckering of macula, bilateral: Secondary | ICD-10-CM | POA: Diagnosis not present

## 2017-08-15 DIAGNOSIS — H353132 Nonexudative age-related macular degeneration, bilateral, intermediate dry stage: Secondary | ICD-10-CM | POA: Diagnosis not present

## 2017-08-15 DIAGNOSIS — H26492 Other secondary cataract, left eye: Secondary | ICD-10-CM | POA: Diagnosis not present

## 2017-08-15 DIAGNOSIS — Z961 Presence of intraocular lens: Secondary | ICD-10-CM | POA: Diagnosis not present

## 2017-08-15 DIAGNOSIS — H348312 Tributary (branch) retinal vein occlusion, right eye, stable: Secondary | ICD-10-CM | POA: Diagnosis not present

## 2017-12-06 DIAGNOSIS — M545 Low back pain: Secondary | ICD-10-CM | POA: Diagnosis not present

## 2018-01-30 DIAGNOSIS — H43393 Other vitreous opacities, bilateral: Secondary | ICD-10-CM | POA: Diagnosis not present

## 2018-01-30 DIAGNOSIS — H348311 Tributary (branch) retinal vein occlusion, right eye, with retinal neovascularization: Secondary | ICD-10-CM | POA: Diagnosis not present

## 2018-01-30 DIAGNOSIS — H35373 Puckering of macula, bilateral: Secondary | ICD-10-CM | POA: Diagnosis not present

## 2018-01-30 DIAGNOSIS — H353132 Nonexudative age-related macular degeneration, bilateral, intermediate dry stage: Secondary | ICD-10-CM | POA: Diagnosis not present

## 2018-03-28 ENCOUNTER — Telehealth: Payer: Self-pay | Admitting: Internal Medicine

## 2018-03-28 NOTE — Telephone Encounter (Signed)
Patient notified

## 2018-03-28 NOTE — Telephone Encounter (Signed)
Agree  Try to crush or cut the vitamin pill also

## 2018-03-28 NOTE — Telephone Encounter (Signed)
The pt swallowed a large vitamin that got stuck in  his throat and after coughing it up he spit up blood.  He is doing well now but wants to know what he should do if anything.  I advised him to chew well, take small bites, and avoid tough meats.

## 2018-03-28 NOTE — Telephone Encounter (Signed)
Pt's wife called to inform that last Friday pt spat up some fresh blood, he is not sure if the vitamins that he has been taking have caused that or if it is something that he should be concerned about. Pls call him.

## 2018-04-17 DIAGNOSIS — E7849 Other hyperlipidemia: Secondary | ICD-10-CM | POA: Diagnosis not present

## 2018-04-17 DIAGNOSIS — Z125 Encounter for screening for malignant neoplasm of prostate: Secondary | ICD-10-CM | POA: Diagnosis not present

## 2018-04-17 DIAGNOSIS — E559 Vitamin D deficiency, unspecified: Secondary | ICD-10-CM | POA: Diagnosis not present

## 2018-04-17 DIAGNOSIS — R739 Hyperglycemia, unspecified: Secondary | ICD-10-CM | POA: Diagnosis not present

## 2018-04-19 DIAGNOSIS — Z7689 Persons encountering health services in other specified circumstances: Secondary | ICD-10-CM | POA: Diagnosis not present

## 2018-04-24 DIAGNOSIS — Z0000 Encounter for general adult medical examination without abnormal findings: Secondary | ICD-10-CM | POA: Diagnosis not present

## 2018-04-24 DIAGNOSIS — R001 Bradycardia, unspecified: Secondary | ICD-10-CM | POA: Diagnosis not present

## 2018-04-24 DIAGNOSIS — G629 Polyneuropathy, unspecified: Secondary | ICD-10-CM | POA: Diagnosis not present

## 2018-04-24 DIAGNOSIS — D692 Other nonthrombocytopenic purpura: Secondary | ICD-10-CM | POA: Diagnosis not present

## 2018-04-24 DIAGNOSIS — R413 Other amnesia: Secondary | ICD-10-CM | POA: Diagnosis not present

## 2018-04-24 DIAGNOSIS — R739 Hyperglycemia, unspecified: Secondary | ICD-10-CM | POA: Diagnosis not present

## 2018-04-24 DIAGNOSIS — K627 Radiation proctitis: Secondary | ICD-10-CM | POA: Diagnosis not present

## 2018-04-24 DIAGNOSIS — J449 Chronic obstructive pulmonary disease, unspecified: Secondary | ICD-10-CM | POA: Diagnosis not present

## 2018-04-24 DIAGNOSIS — R1310 Dysphagia, unspecified: Secondary | ICD-10-CM | POA: Diagnosis not present

## 2018-04-24 DIAGNOSIS — Z1331 Encounter for screening for depression: Secondary | ICD-10-CM | POA: Diagnosis not present

## 2018-04-24 DIAGNOSIS — E785 Hyperlipidemia, unspecified: Secondary | ICD-10-CM | POA: Diagnosis not present

## 2018-04-24 DIAGNOSIS — H6610 Chronic tubotympanic suppurative otitis media, unspecified: Secondary | ICD-10-CM | POA: Diagnosis not present

## 2018-06-07 DIAGNOSIS — L814 Other melanin hyperpigmentation: Secondary | ICD-10-CM | POA: Diagnosis not present

## 2018-06-07 DIAGNOSIS — D229 Melanocytic nevi, unspecified: Secondary | ICD-10-CM | POA: Diagnosis not present

## 2018-06-07 DIAGNOSIS — D1801 Hemangioma of skin and subcutaneous tissue: Secondary | ICD-10-CM | POA: Diagnosis not present

## 2018-06-07 DIAGNOSIS — L819 Disorder of pigmentation, unspecified: Secondary | ICD-10-CM | POA: Diagnosis not present

## 2018-06-07 DIAGNOSIS — L570 Actinic keratosis: Secondary | ICD-10-CM | POA: Diagnosis not present

## 2018-06-07 DIAGNOSIS — L821 Other seborrheic keratosis: Secondary | ICD-10-CM | POA: Diagnosis not present

## 2018-07-17 DIAGNOSIS — Z961 Presence of intraocular lens: Secondary | ICD-10-CM | POA: Diagnosis not present

## 2018-07-17 DIAGNOSIS — H353132 Nonexudative age-related macular degeneration, bilateral, intermediate dry stage: Secondary | ICD-10-CM | POA: Diagnosis not present

## 2018-07-17 DIAGNOSIS — H33312 Horseshoe tear of retina without detachment, left eye: Secondary | ICD-10-CM | POA: Diagnosis not present

## 2018-07-17 DIAGNOSIS — H348312 Tributary (branch) retinal vein occlusion, right eye, stable: Secondary | ICD-10-CM | POA: Diagnosis not present

## 2018-08-23 DIAGNOSIS — H43393 Other vitreous opacities, bilateral: Secondary | ICD-10-CM | POA: Diagnosis not present

## 2018-08-23 DIAGNOSIS — H348312 Tributary (branch) retinal vein occlusion, right eye, stable: Secondary | ICD-10-CM | POA: Diagnosis not present

## 2018-08-23 DIAGNOSIS — H35373 Puckering of macula, bilateral: Secondary | ICD-10-CM | POA: Diagnosis not present

## 2018-08-23 DIAGNOSIS — H353132 Nonexudative age-related macular degeneration, bilateral, intermediate dry stage: Secondary | ICD-10-CM | POA: Diagnosis not present

## 2018-12-18 ENCOUNTER — Encounter (HOSPITAL_COMMUNITY): Payer: Self-pay

## 2018-12-18 ENCOUNTER — Other Ambulatory Visit: Payer: Self-pay

## 2018-12-18 ENCOUNTER — Emergency Department (HOSPITAL_COMMUNITY): Payer: PPO

## 2018-12-18 ENCOUNTER — Emergency Department (HOSPITAL_COMMUNITY)
Admission: EM | Admit: 2018-12-18 | Discharge: 2018-12-18 | Disposition: A | Discharge status: Home / Self Care | Payer: PPO | Attending: Emergency Medicine | Admitting: Emergency Medicine

## 2018-12-18 DIAGNOSIS — R4182 Altered mental status, unspecified: Secondary | ICD-10-CM | POA: Diagnosis present

## 2018-12-18 DIAGNOSIS — Z79899 Other long term (current) drug therapy: Secondary | ICD-10-CM | POA: Insufficient documentation

## 2018-12-18 DIAGNOSIS — G459 Transient cerebral ischemic attack, unspecified: Secondary | ICD-10-CM | POA: Insufficient documentation

## 2018-12-18 DIAGNOSIS — J449 Chronic obstructive pulmonary disease, unspecified: Secondary | ICD-10-CM | POA: Diagnosis not present

## 2018-12-18 DIAGNOSIS — Z7982 Long term (current) use of aspirin: Secondary | ICD-10-CM | POA: Diagnosis not present

## 2018-12-18 DIAGNOSIS — R001 Bradycardia, unspecified: Secondary | ICD-10-CM | POA: Diagnosis not present

## 2018-12-18 DIAGNOSIS — R413 Other amnesia: Secondary | ICD-10-CM | POA: Diagnosis not present

## 2018-12-18 LAB — COMPREHENSIVE METABOLIC PANEL
ALT: 19 U/L (ref 0–44)
AST: 29 U/L (ref 15–41)
Albumin: 3.9 g/dL (ref 3.5–5.0)
Alkaline Phosphatase: 53 U/L (ref 38–126)
Anion gap: 13 (ref 5–15)
BUN: 14 mg/dL (ref 8–23)
CO2: 23 mmol/L (ref 22–32)
Calcium: 9 mg/dL (ref 8.9–10.3)
Chloride: 104 mmol/L (ref 98–111)
Creatinine, Ser: 1.02 mg/dL (ref 0.61–1.24)
GFR calc Af Amer: >60 mL/min (ref >60)
GFR calc non Af Amer: >60 mL/min (ref >60)
Glucose, Bld: 103 mg/dL — ABNORMAL HIGH (ref 70–99)
Potassium: 4.3 mmol/L (ref 3.5–5.1)
Sodium: 140 mmol/L (ref 135–145)
Total Bilirubin: 1 mg/dL (ref 0.3–1.2)
Total Protein: 6.6 g/dL (ref 6.5–8.1)

## 2018-12-18 LAB — I-STAT CHEM 8, ED
BUN: 18 mg/dL (ref 8–23)
Calcium, Ion: 1.14 mmol/L — ABNORMAL LOW (ref 1.15–1.40)
Chloride: 106 mmol/L (ref 98–111)
Creatinine, Ser: 0.9 mg/dL (ref 0.61–1.24)
Glucose, Bld: 97 mg/dL (ref 70–99)
HCT: 37 % — ABNORMAL LOW (ref 39.0–52.0)
Hemoglobin: 12.6 g/dL — ABNORMAL LOW (ref 13.0–17.0)
Potassium: 4.1 mmol/L (ref 3.5–5.1)
Sodium: 139 mmol/L (ref 135–145)
TCO2: 26 mmol/L (ref 22–32)

## 2018-12-18 LAB — DIFFERENTIAL
Abs Immature Granulocytes: 0.02 10*3/uL (ref 0.00–0.07)
Basophils Absolute: 0 10*3/uL (ref 0.0–0.1)
Basophils Relative: 1 %
Eosinophils Absolute: 0.2 10*3/uL (ref 0.0–0.5)
Eosinophils Relative: 3 %
Immature Granulocytes: 0 %
Lymphocytes Relative: 27 %
Lymphs Abs: 1.6 10*3/uL (ref 0.7–4.0)
Monocytes Absolute: 0.7 10*3/uL (ref 0.1–1.0)
Monocytes Relative: 11 %
Neutro Abs: 3.5 10*3/uL (ref 1.7–7.7)
Neutrophils Relative %: 58 %

## 2018-12-18 LAB — CBC
HCT: 40.4 % (ref 39.0–52.0)
Hemoglobin: 13.1 g/dL (ref 13.0–17.0)
MCH: 30.9 pg (ref 26.0–34.0)
MCHC: 32.4 g/dL (ref 30.0–36.0)
MCV: 95.3 fL (ref 80.0–100.0)
Platelets: 237 10*3/uL (ref 150–400)
RBC: 4.24 MIL/uL (ref 4.22–5.81)
RDW: 13.9 % (ref 11.5–15.5)
WBC: 6 10*3/uL (ref 4.0–10.5)
nRBC: 0 % (ref 0.0–0.2)

## 2018-12-18 LAB — PROTIME-INR
INR: 1.1 (ref 0.8–1.2)
Prothrombin Time: 14.1 seconds (ref 11.4–15.2)

## 2018-12-18 LAB — APTT: aPTT: 28 seconds (ref 24–36)

## 2018-12-18 LAB — CBG MONITORING, ED: Glucose-Capillary: 103 mg/dL — ABNORMAL HIGH (ref 70–99)

## 2018-12-18 MED ORDER — ASPIRIN 81 MG PO CHEW: 81.0000 mg | CHEWABLE_TABLET | Freq: Every day | ORAL | 0 refills | Status: AC

## 2018-12-18 MED ORDER — SODIUM CHLORIDE 0.9% FLUSH: 3.0000 mL | Freq: Once | INTRAVENOUS | Status: DC

## 2018-12-18 NOTE — ED Triage Notes (Signed)
Patient complains of a short period of memory loss this morning that has now resolved. Patient alert and oriented on assessment, denies pain, no drift, steady gait to ED. Mae X 4

## 2018-12-18 NOTE — ED Provider Notes (Signed)
[facility] EMERGENCY DEPARTMENT Provider Note   CSN: [id number] Arrival date & time: 12/18/18  1445     History No chief complaint on file.   Dennis Reynolds Dennis Reynolds is a 85 y.o. male.  HPI  Dennis Reynolds is a 85 y.o. @gender @ with a medical history of prostate ca, copd who presents to the ED today for brief episode of amnesia and concern for TIA. He reports was driving with his wife this evening and for two minutes was unable to remember [facility] that he [facility] close by. He reports once he gathered his surroundings, this resolved and he had no further difficulty with forgetfullness. He denies previous similar episodes. Denies numbness, weakness, speech change or difficulty with gait. He has never had a stroke and reports is relatively healthy. He feels at baseline now. Denies any recent illness.      Past Medical History:  Diagnosis Date  . Arthritis   . BPH (benign prostatic hyperplasia)   . COPD (chronic obstructive pulmonary disease) ([place])   . Diverticulosis   . Duodenal ulcer due to Helicobacter pylori   . ED (erectile dysfunction)   . GERD (gastroesophageal reflux disease)   . Hyperlipidemia   . Internal hemorrhoid   . Neuropathy    LLE  . Osteopenia   . Otitis media    chronic  . Prostate cancer ([place]) 2003  . Radiation proctitis   . Retinal tear    x 2  . Sinus bradycardia   . Stricture and stenosis of esophagus   . Vitamin D deficiency     Patient Active Problem List   Diagnosis Date Noted  . IBS (irritable bowel syndrome) 07/02/2011  . GERD with stricture 07/02/2011  . Radiation proctitis 04/23/2011    Past Surgical History:  Procedure Laterality Date  . COLONOSCOPY     multiple  . ESOPHAGOGASTRODUODENOSCOPY    . [place]   right  . INNER EAR SURGERY     right  . OTHER SURGICAL HISTORY     radiation for prostate  . PROSTATECTOMY    . RETINAL DETACHMENT SURGERY     x 2, left  . TONSILLECTOMY AND  ADENOIDECTOMY    . VASECTOMY         Family History  Problem Relation Age of Onset  . Colon cancer Sister   . Diabetes Sister   . Heart disease Mother   . Prostate cancer Father   . Parkinsonism Father     Social History   Tobacco Use  . Smoking status: Never Smoker  . Smokeless tobacco: Never Used  Substance Use Topics  . Alcohol use: No  . Drug use: No    Home Medications Prior to Admission medications   Medication Sig Start Date End Date Taking? Authorizing Provider  alendronate (FOSAMAX) 70 MG tablet Take 70 mg by mouth once a week. Take with a full glass of water on an empty stomach.    [provider]  aspirin 81 MG chewable tablet Chew 1 tablet (81 mg total) by mouth daily. 12/18/18 01/17/19  Brycen Bean, Lovena Le, MD  CALCIUM-VITAMIN D PO Take 1 tablet by mouth daily.    [provider]  Flaxseed, Linseed, (FLAX SEED OIL) 1000 MG CAPS Take 1 capsule by mouth daily.    [provider]  Multiple Vitamin (MULTIVITAMIN) capsule Take 1 capsule by mouth daily.    [provider]  pantoprazole (PROTONIX) 40 MG tablet  Take 1 tablet (40 mg total) by mouth daily before breakfast. Take 30-60 minutes before breakfast 08/17/16 08/17/17  Gatha Mayer, MD  vitamin E 200 UNIT capsule Take 400 Units by mouth daily.    [provider]    Allergies    Patient has no known allergies.  Review of Systems   Review of Systems  Constitutional: Negative for chills and fever.  HENT: Negative for ear pain and sore throat.   Eyes: Negative for pain and visual disturbance.  Respiratory: Negative for cough and shortness of breath.   Cardiovascular: Negative for chest pain and palpitations.  Gastrointestinal: Negative for abdominal pain and vomiting.  Genitourinary: Negative for dysuria and hematuria.  Musculoskeletal: Negative for arthralgias and back pain.  Skin: Negative for color change and rash.  Neurological: Negative for dizziness, seizures,  syncope, facial asymmetry, speech difficulty, weakness, numbness and headaches.       "Forgetfullness"  All other systems reviewed and are negative.   Physical Exam Updated Vital Signs BP (!) 145/67 (BP Location: Right Arm)   Pulse (!) 58   Temp 98.5 F (36.9 C) (Oral)   Resp 16   SpO2 100%   Physical Exam Vitals and nursing note reviewed.  Constitutional:      General: He is not in acute distress.    Appearance: Normal appearance. He is well-developed. He is not ill-appearing.  HENT:     Head: Normocephalic and atraumatic.     Right Ear: External ear normal.     Left Ear: External ear normal.     Nose: Nose normal. No congestion.     Mouth/Throat:     Mouth: Mucous membranes are moist.  Eyes:     General:        Right eye: No discharge.        Left eye: No discharge.     Conjunctiva/sclera: Conjunctivae normal.  Cardiovascular:     Rate and Rhythm: Normal rate and regular rhythm.     Pulses: Normal pulses.     Heart sounds: Normal heart sounds. No murmur.  Pulmonary:     Effort: Pulmonary effort is normal. No respiratory distress.     Breath sounds: Normal breath sounds. No wheezing or rales.  Abdominal:     General: Abdomen is flat. There is no distension.     Palpations: Abdomen is soft.     Tenderness: There is no abdominal tenderness.  Musculoskeletal:        General: No signs of injury. Normal range of motion.     Cervical back: Normal range of motion and neck supple.  Skin:    General: Skin is warm and dry.     Capillary Refill: Capillary refill takes less than 2 seconds.  Neurological:     General: No focal deficit present.     Mental Status: He is alert and oriented to person, place, and time. Mental status is at baseline.     Cranial Nerves: No cranial nerve deficit.     Sensory: No sensory deficit.     Motor: No weakness.     Coordination: Coordination normal.     Gait: Gait normal.     Comments: Awake, alert, and oriented x4 Gait intact Finger to  nose intact No pronator drift Sensation intact to LT Strength 5/5 in the BUE and BLE  CRANIAL NERVES: 2 (Optic)-PERRLA. VF intact to confrontation. 3/4/6 (Oculomotor, Trochlear, Abudcens)- EOMI 5 (Trigeminal)-Sensation intact topinprick 7 (Facial)-Symmetric facial expression 8 (Vestibulococchlear)-Responds to voice 9/10 (  Glossopharyngeal)-Symmetric palate and uvula elevation 11 (Spinal accessory)-Head midline 12 (Hypoglossal)-Tongue Midline   Psychiatric:        Mood and Affect: Mood normal.        Behavior: Behavior normal.     ED Results / Procedures / Treatments   Labs (all labs ordered are listed, but only abnormal results are displayed) Labs Reviewed  COMPREHENSIVE METABOLIC PANEL - Abnormal; Notable for the following components:      Result Value   Glucose, Bld 103 (*)    All other components within normal limits  I-STAT CHEM 8, ED - Abnormal; Notable for the following components:   Calcium, Ion 1.14 (*)    Hemoglobin 12.6 (*)    HCT 37.0 (*)    All other components within normal limits  CBG MONITORING, ED - Abnormal; Notable for the following components:   Glucose-Capillary 103 (*)    All other components within normal limits  PROTIME-INR  APTT  CBC  DIFFERENTIAL    EKG EKG Interpretation  Date/Time:  Monday December 18 2018 14:52:44 EST Ventricular Rate:  58 PR Interval:  182 QRS Duration: 78 QT Interval:  416 QTC Calculation: 408 R Axis:   20 Text Interpretation: Sinus bradycardia Otherwise normal ECG No acute changes No significant change since last tracing Confirmed by Varney Biles [phone]) on 12/18/2018 6:14:47 PM   Radiology No results found.  Procedures Procedures (including critical care time)  Medications Ordered in ED Medications - No data to display  ED Course  I have reviewed the triage vital signs and the nursing notes.  Pertinent labs & imaging results that were available during my care of the patient were  reviewed by me and considered in my medical decision making (see chart for details).    MDM Rules/Calculators/A&P                      Dennis Reynolds is a 85 y.o. male with a medical history of prostate ca, copd who presents to the ED today for brief episode of amnesia and concern for TIA. He reports was driving with his wife this evening and for two minutes was unable to remember where the [facility] was that he lives close by. Denies numbness, weakness, speech change or difficulty with gait. He feels at baseline now. HPI and physical exam as above. He presents awake, alert, hemodynamically stable, afebrile, non toxic. His neurologic exam is nonfocal. Low suspicion for cva. It is possible he had a TIA. Also his symptoms may be due to early dementia or another neurologic process. Discussed starting aspirin 81 mg daily for prevention and to follow up with his PCP. He understands and is amenable with plan.    Final Clinical Impression(s) / ED Diagnoses Final diagnoses:  TIA (transient ischemic attack)    Rx / DC Orders ED Discharge Orders         Ordered    aspirin 81 MG chewable tablet  Daily     12/18/18 1820           Hezikiah Retzloff, Lovena Le, MD 12/20/18 Einar Crow    Varney Biles, MD 12/25/18 Vernelle Emerald

## 2019-01-15 NOTE — Progress Notes (Signed)
Patient no showed appointment

## 2019-01-16 ENCOUNTER — Ambulatory Visit (INDEPENDENT_AMBULATORY_CARE_PROVIDER_SITE_OTHER): Payer: PPO | Admitting: Neurology

## 2019-01-16 DIAGNOSIS — R410 Disorientation, unspecified: Secondary | ICD-10-CM

## 2019-01-17 NOTE — Progress Notes (Signed)
NEUROLOGY CONSULTATION NOTE  Dennis Reynolds MRN: [id number] DOB: [birth year]  Referring provider: ED referral Primary care provider: Shon Baton, MD  Reason for consult:  TIA  HISTORY OF PRESENT ILLNESS: Dennis Reynolds, Jr. Is an 86 year old right-handed white male with COPD, hyperlipidemia and history of prostate cancer who presents for recent transient ischemic attack.  He is accompanied by his wife who supplements history.   ED note reviewed.  On 12/18/2018, he was driving with his wife on a familiar route.  They were driving home on [address] from [place], where they go everyday.  His wife then remembered that she needed to get something at [facility], which is across the street from the [place].  He turned the car around but didn't remember where the [facility] was located (this is an area they frequent everyday).  No associated headache, slurred speech, facial droop or focal weakness.  Event lasted about 2 minutes.  He presented to the [facility] ED for further evaluation.  Neurologic exam was reportedly unremarkable. CT head without contrast was personally reviewed and showed age-related atrophy and chronic small vessel ischemic changes but no acute intracranial abnormality.  He was advised to start ASA 81mg  daily.  No subsequent events.  PAST MEDICAL HISTORY: Past Medical History:  Diagnosis Date  . Arthritis   . BPH (benign prostatic hyperplasia)   . COPD (chronic obstructive pulmonary disease) ([place])   . Diverticulosis   . Duodenal ulcer due to Helicobacter pylori   . ED (erectile dysfunction)   . GERD (gastroesophageal reflux disease)   . Hyperlipidemia   . Internal hemorrhoid   . Neuropathy    LLE  . Osteopenia   . Otitis media    chronic  . Prostate cancer ([place]) 2003  . Radiation proctitis   . Retinal tear    x 2  . Sinus bradycardia   . Stricture and stenosis of esophagus   . Vitamin D deficiency     PAST SURGICAL HISTORY: Past  Surgical History:  Procedure Laterality Date  . COLONOSCOPY     multiple  . ESOPHAGOGASTRODUODENOSCOPY    . [place]   right  . INNER EAR SURGERY     right  . OTHER SURGICAL HISTORY     radiation for prostate  . PROSTATECTOMY    . RETINAL DETACHMENT SURGERY     x 2, left  . TONSILLECTOMY AND ADENOIDECTOMY    . VASECTOMY      MEDICATIONS: Current Outpatient Medications on File Prior to Visit  Medication Sig Dispense Refill  . alendronate (FOSAMAX) 70 MG tablet Take 70 mg by mouth once a week. Take with a full glass of water on an empty stomach.    Marland Kitchen aspirin 81 MG chewable tablet Chew 1 tablet (81 mg total) by mouth daily. 30 tablet 0  . CALCIUM-VITAMIN D PO Take 1 tablet by mouth daily.    . Flaxseed, Linseed, (FLAX SEED OIL) 1000 MG CAPS Take 1 capsule by mouth daily.    . Multiple Vitamin (MULTIVITAMIN) capsule Take 1 capsule by mouth daily.    . pantoprazole (PROTONIX) 40 MG tablet Take 1 tablet (40 mg total) by mouth daily before breakfast. Take 30-60 minutes before breakfast 90 tablet 3  . vitamin E 200 UNIT capsule Take 400 Units by mouth daily.     No current facility-administered medications on file prior to visit.    ALLERGIES: No Known Allergies  FAMILY HISTORY: Family  History  Problem Relation Age of Onset  . Colon cancer Sister   . Diabetes Sister   . Heart disease Mother   . Prostate cancer Father   . Parkinsonism Father    SOCIAL HISTORY: Social History   Socioeconomic History  . Marital status: Married    Spouse name: [name]  . Number of children: 2  . Years of education: Not on file  . Highest education level: Not on file  Occupational History  . Occupation: retired Airline pilot  Tobacco Use  . Smoking status: Never Smoker  . Smokeless tobacco: Never Used  Substance and Sexual Activity  . Alcohol use: No  . Drug use: No  . Sexual activity: Not on file  Other Topics Concern  . Not on file  Social History Narrative  .  Not on file   Social Determinants of Health   Financial Resource Strain:   . Difficulty of Paying Living Expenses: Not on file  Food Insecurity:   . Worried About Charity fundraiser in the Last Year: Not on file  . Ran Out of Food in the Last Year: Not on file  Transportation Needs:   . Lack of Transportation (Medical): Not on file  . Lack of Transportation (Non-Medical): Not on file  Physical Activity:   . Days of Exercise per Week: Not on file  . Minutes of Exercise per Session: Not on file  Stress:   . Feeling of Stress : Not on file  Social Connections:   . Frequency of Communication with Friends and Family: Not on file  . Frequency of Social Gatherings with Friends and Family: Not on file  . Attends Religious Services: Not on file  . Active Member of Clubs or Organizations: Not on file  . Attends Archivist Meetings: Not on file  . Marital Status: Not on file  Intimate Partner Violence:   . Fear of Current or Ex-Partner: Not on file  . Emotionally Abused: Not on file  . Physically Abused: Not on file  . Sexually Abused: Not on file    REVIEW OF SYSTEMS: Constitutional: No fevers, chills, or sweats, no generalized fatigue, change in appetite Eyes: No visual changes, double vision, eye pain Ear, nose and throat: No hearing loss, ear pain, nasal congestion, sore throat Cardiovascular: No chest pain, palpitations Respiratory:  No shortness of breath at rest or with exertion, wheezes GastrointestinaI: No nausea, vomiting, diarrhea, abdominal pain, fecal incontinence Genitourinary:  No dysuria, urinary retention or frequency Musculoskeletal:  No neck pain, back pain Integumentary: No rash, pruritus, skin lesions Neurological: as above Psychiatric: No depression, insomnia, anxiety Endocrine: No palpitations, fatigue, diaphoresis, mood swings, change in appetite, change in weight, increased thirst Hematologic/Lymphatic:  No purpura,  petechiae. Allergic/Immunologic: no itchy/runny eyes, nasal congestion, recent allergic reactions, rashes  PHYSICAL EXAM: Blood pressure (!) 159/92, pulse 74, height 5\' 8"  (1.727 m), weight 164 lb (74.4 kg), SpO2 98 %. General: No acute distress.  Patient appears well-groomed.  Head:  Normocephalic/atraumatic Eyes:  fundi examined but not visualized Neck: supple, no paraspinal tenderness, full range of motion Back: No paraspinal tenderness Heart: regular rate and rhythm Lungs: Clear to auscultation bilaterally. Vascular: No carotid bruits. Neurological Exam: Mental status: alert and oriented to person, place, and time, recent and remote memory intact, fund of knowledge intact, attention and concentration intact, speech fluent and not dysarthric, language intact. Cranial nerves: CN I: not tested CN II: pupils equal, round and reactive to light, visual fields intact CN III,  IV, VI:  full range of motion, no nystagmus, no ptosis CN V: facial sensation intact CN VII: upper and lower face symmetric CN VIII: hearing intact CN IX, X: gag intact, uvula midline CN XI: sternocleidomastoid and trapezius muscles intact CN XII: tongue midline Bulk & Tone: normal, no fasciculations. Motor:  5/5 throughout  Sensation:  Sensory sensation intact; vibration sensation reduced in feet. Deep Tendon Reflexes:  1+ throughout, toes downgoing.  Finger to nose testing:  Without dysmetria.  Heel to shin:  Without dysmetria.  Gait:  Normal station and stride.  Romberg negative.  IMPRESSION: Brief transient confusion.  Unclear etiology.  No real lateralizing symptoms to confidently say he had a transient ischemic attack.  I would still recommend taking ASA 81mg  daily  PLAN: 1.  ASA 81mg  daily 2.  Check routine EEG 3.  Check MRI of brain.  If there is any evidence of subacute infarcts, then would proceed with stroke workup. If only chronic vascular changes, would not perform further testing. 4.  Follow up  in 4 months.  Thank you for allowing me to take part in the care of this patient.  Metta Clines, DO  CC: Shon Baton, MD

## 2019-01-19 ENCOUNTER — Ambulatory Visit (INDEPENDENT_AMBULATORY_CARE_PROVIDER_SITE_OTHER): Payer: PPO | Admitting: Neurology

## 2019-01-19 ENCOUNTER — Other Ambulatory Visit: Payer: Self-pay

## 2019-01-19 ENCOUNTER — Encounter: Payer: Self-pay | Admitting: Neurology

## 2019-01-19 VITALS — BP 159/92 | HR 74 | Ht 68.0 in | Wt 164.0 lb

## 2019-01-19 DIAGNOSIS — R410 Disorientation, unspecified: Secondary | ICD-10-CM | POA: Diagnosis not present

## 2019-01-19 NOTE — Patient Instructions (Addendum)
It may have been a light stroke but not really sure.  1.  I would like to check MRI of brain without contrast 2.  I would also like to check a routine EEG 3.  Start aspirin 81mg  daily 4.  Further recommendations pen Follow up in 4 months. We have sent a referral to [facility] for your MRI and they will call you directly to schedule your appointment. They are located at [place]. If you need to contact them directly please call [phone]. The EEG will be scheduled here at [facility].

## 2019-01-24 ENCOUNTER — Ambulatory Visit (INDEPENDENT_AMBULATORY_CARE_PROVIDER_SITE_OTHER): Payer: PPO | Admitting: Neurology

## 2019-01-24 ENCOUNTER — Other Ambulatory Visit: Payer: Self-pay

## 2019-01-24 DIAGNOSIS — R410 Disorientation, unspecified: Secondary | ICD-10-CM

## 2019-01-25 NOTE — Procedures (Signed)
ELECTROENCEPHALOGRAM REPORT  Date of Study: 01/24/2019  Patient's Name: Dennis Reynolds MRN: [id number] Date of Birth: 05/19/1931   Clinical History: 86 year old male with brief transient confusion.  Medications: FOSAMAX 70 MG tablet aspirin 81 MG chewable tablet CALCIUM-VITAMIN D PO FLAX SEED OIL 1000 MG CAPS MULTIVITAMIN capsule PROTONIX 40 MG tablet vitamin E 200 UNIT capsule  Technical Summary: A multichannel digital EEG recording measured by the international 10-20 system with electrodes applied with paste and impedances below 5000 ohms performed in our laboratory with EKG monitoring in an awake and sleep patient.  Hyperventilation not performed as patient is wearing a mask due to Covid pandemic.  Photic stimulation was performed.  The digital EEG was referentially recorded, reformatted, and digitally filtered in a variety of bipolar and referential montages for optimal display.    Description: The patient is awake and asleep during the recording.  During maximal wakefulness, there is a symmetric, medium voltage 9 Hz posterior dominant rhythm that attenuates with eye opening.  The record is symmetric.  During drowsiness and sleep, there is an increase in theta slowing of the background.  Vertex waves and symmetric sleep spindles were seen.  Hyperventilation and photic stimulation did not elicit any abnormalities.  There were no epileptiform discharges or electrographic seizures seen.    EKG lead was unremarkable.  Impression: This awake and asleep EEG is normal.    Clinical Correlation: A normal EEG does not exclude a clinical diagnosis of epilepsy.  If further clinical questions remain, prolonged EEG may be helpful.  Clinical correlation is advised.   Metta Clines, DO

## 2019-02-18 ENCOUNTER — Ambulatory Visit: Payer: PPO

## 2019-02-26 ENCOUNTER — Other Ambulatory Visit: Payer: Self-pay

## 2019-02-26 ENCOUNTER — Ambulatory Visit
Admission: RE | Admit: 2019-02-26 | Discharge: 2019-02-26 | Disposition: A | Discharge status: Home / Self Care | Payer: PPO | Source: Ambulatory Visit | Attending: Neurology | Admitting: Neurology

## 2019-02-26 DIAGNOSIS — R410 Disorientation, unspecified: Secondary | ICD-10-CM | POA: Diagnosis not present

## 2019-03-05 DIAGNOSIS — M7072 Other bursitis of hip, left hip: Secondary | ICD-10-CM | POA: Diagnosis not present

## 2019-03-05 DIAGNOSIS — M25552 Pain in left hip: Secondary | ICD-10-CM | POA: Diagnosis not present

## 2019-03-20 DIAGNOSIS — M25562 Pain in left knee: Secondary | ICD-10-CM | POA: Diagnosis not present

## 2019-03-20 DIAGNOSIS — M25552 Pain in left hip: Secondary | ICD-10-CM | POA: Diagnosis not present

## 2019-03-20 DIAGNOSIS — M1712 Unilateral primary osteoarthritis, left knee: Secondary | ICD-10-CM | POA: Diagnosis not present

## 2019-04-17 DIAGNOSIS — M25562 Pain in left knee: Secondary | ICD-10-CM | POA: Diagnosis not present

## 2019-04-17 DIAGNOSIS — M25552 Pain in left hip: Secondary | ICD-10-CM | POA: Diagnosis not present

## 2019-04-18 DIAGNOSIS — H35373 Puckering of macula, bilateral: Secondary | ICD-10-CM | POA: Diagnosis not present

## 2019-04-18 DIAGNOSIS — H353132 Nonexudative age-related macular degeneration, bilateral, intermediate dry stage: Secondary | ICD-10-CM | POA: Diagnosis not present

## 2019-04-18 DIAGNOSIS — H348312 Tributary (branch) retinal vein occlusion, right eye, stable: Secondary | ICD-10-CM | POA: Diagnosis not present

## 2019-04-18 DIAGNOSIS — H43813 Vitreous degeneration, bilateral: Secondary | ICD-10-CM | POA: Diagnosis not present

## 2019-04-20 DIAGNOSIS — E7849 Other hyperlipidemia: Secondary | ICD-10-CM | POA: Diagnosis not present

## 2019-04-20 DIAGNOSIS — R739 Hyperglycemia, unspecified: Secondary | ICD-10-CM | POA: Diagnosis not present

## 2019-04-20 DIAGNOSIS — M859 Disorder of bone density and structure, unspecified: Secondary | ICD-10-CM | POA: Diagnosis not present

## 2019-04-20 DIAGNOSIS — Z125 Encounter for screening for malignant neoplasm of prostate: Secondary | ICD-10-CM | POA: Diagnosis not present

## 2019-04-26 DIAGNOSIS — R739 Hyperglycemia, unspecified: Secondary | ICD-10-CM | POA: Diagnosis not present

## 2019-04-26 DIAGNOSIS — R413 Other amnesia: Secondary | ICD-10-CM | POA: Diagnosis not present

## 2019-04-26 DIAGNOSIS — M25569 Pain in unspecified knee: Secondary | ICD-10-CM | POA: Diagnosis not present

## 2019-04-26 DIAGNOSIS — Z0000 Encounter for general adult medical examination without abnormal findings: Secondary | ICD-10-CM | POA: Diagnosis not present

## 2019-04-26 DIAGNOSIS — Z8546 Personal history of malignant neoplasm of prostate: Secondary | ICD-10-CM | POA: Diagnosis not present

## 2019-04-26 DIAGNOSIS — Z1331 Encounter for screening for depression: Secondary | ICD-10-CM | POA: Diagnosis not present

## 2019-04-26 DIAGNOSIS — Z1212 Encounter for screening for malignant neoplasm of rectum: Secondary | ICD-10-CM | POA: Diagnosis not present

## 2019-04-26 DIAGNOSIS — M545 Low back pain: Secondary | ICD-10-CM | POA: Diagnosis not present

## 2019-04-26 DIAGNOSIS — M7072 Other bursitis of hip, left hip: Secondary | ICD-10-CM | POA: Diagnosis not present

## 2019-04-26 DIAGNOSIS — K627 Radiation proctitis: Secondary | ICD-10-CM | POA: Diagnosis not present

## 2019-04-26 DIAGNOSIS — N393 Stress incontinence (female) (male): Secondary | ICD-10-CM | POA: Diagnosis not present

## 2019-04-26 DIAGNOSIS — N401 Enlarged prostate with lower urinary tract symptoms: Secondary | ICD-10-CM | POA: Diagnosis not present

## 2019-04-26 DIAGNOSIS — R6882 Decreased libido: Secondary | ICD-10-CM | POA: Diagnosis not present

## 2019-04-26 DIAGNOSIS — R82998 Other abnormal findings in urine: Secondary | ICD-10-CM | POA: Diagnosis not present

## 2019-04-26 DIAGNOSIS — G629 Polyneuropathy, unspecified: Secondary | ICD-10-CM | POA: Diagnosis not present

## 2019-05-03 DIAGNOSIS — M8589 Other specified disorders of bone density and structure, multiple sites: Secondary | ICD-10-CM | POA: Diagnosis not present

## 2019-05-30 NOTE — Progress Notes (Signed)
NEUROLOGY FOLLOW UP OFFICE NOTE  LOWIS MASHEK [id number]  HISTORY OF PRESENT ILLNESS: Dennis Reynolds, Jr. Is an 86 year old right-handed white male with COPD, hyperlipidemia and history of prostate cancer who follows up for transient confusion.  He is accompanied by his wife who supplements history.    UPDATE: Awake and asleep EEG on 01/24/2019 was normal.  MRI of brain without contrast on 02/26/2019 personally reviewed showed mild to moderate chronic small vessel ischemic changes and generalized atrophy but no acute intracranial abnormality.  HISTORY: On 12/18/2018, he was driving with his wife on a familiar route.  They were driving home on [address] from [place], where they go everyday.  His wife then remembered that she needed to get something at [facility], which is across the street from the [place].  He turned the car around but didn't remember where the [facility] was located (this is an area they frequent everyday).  No associated headache, slurred speech, facial droop or focal weakness.  Event lasted about 2 minutes.  He presented to the [facility] ED for further evaluation.  Neurologic exam was reportedly unremarkable. CT head without contrast was personally reviewed and showed age-related atrophy and chronic small vessel ischemic changes but no acute intracranial abnormality.  He was advised to start ASA 81mg  daily.  No subsequent events.  PAST MEDICAL HISTORY: Past Medical History:  Diagnosis Date  . Arthritis   . BPH (benign prostatic hyperplasia)   . COPD (chronic obstructive pulmonary disease) ([place])   . Diverticulosis   . Duodenal ulcer due to Helicobacter pylori   . ED (erectile dysfunction)   . GERD (gastroesophageal reflux disease)   . Hyperlipidemia   . Internal hemorrhoid   . Neuropathy    LLE  . Osteopenia   . Otitis media    chronic  . Prostate cancer ([place]) 2003  . Radiation proctitis   . Retinal tear    x 2  . Sinus bradycardia     . Stricture and stenosis of esophagus   . Vitamin D deficiency     MEDICATIONS: Current Outpatient Medications on File Prior to Visit  Medication Sig Dispense Refill  . alendronate (FOSAMAX) 70 MG tablet Take 70 mg by mouth once a week. Take with a full glass of water on an empty stomach.    Marland Kitchen CALCIUM-VITAMIN D PO Take 1 tablet by mouth daily.    . Flaxseed, Linseed, (FLAX SEED OIL) 1000 MG CAPS Take 1 capsule by mouth daily.    . Multiple Vitamin (MULTIVITAMIN) capsule Take 1 capsule by mouth daily.    . pantoprazole (PROTONIX) 40 MG tablet Take 1 tablet (40 mg total) by mouth daily before breakfast. Take 30-60 minutes before breakfast 90 tablet 3  . vitamin E 200 UNIT capsule Take 400 Units by mouth daily.     No current facility-administered medications on file prior to visit.    ALLERGIES: No Known Allergies  FAMILY HISTORY: Family History  Problem Relation Age of Onset  . Colon cancer Sister   . Diabetes Sister   . Heart disease Mother   . Prostate cancer Father   . Parkinsonism Father     SOCIAL HISTORY: Social History   Socioeconomic History  . Marital status: Married    Spouse name: [name]  . Number of children: 2  . Years of education: Not on file  . Highest education level: Not on file  Occupational History  . Occupation: retired Airline pilot  Tobacco  Use  . Smoking status: Never Smoker  . Smokeless tobacco: Never Used  Substance and Sexual Activity  . Alcohol use: No  . Drug use: No  . Sexual activity: Not on file  Other Topics Concern  . Not on file  Social History Narrative   Right handed   Two story home   occ caffeine   Social Determinants of Health   Financial Resource Strain:   . Difficulty of Paying Living Expenses:   Food Insecurity:   . Worried About Charity fundraiser in the Last Year:   . Arboriculturist in the Last Year:   Transportation Needs:   . Film/video editor (Medical):   Marland Kitchen Lack of Transportation (Non-Medical):    Physical Activity:   . Days of Exercise per Week:   . Minutes of Exercise per Session:   Stress:   . Feeling of Stress :   Social Connections:   . Frequency of Communication with Friends and Family:   . Frequency of Social Gatherings with Friends and Family:   . Attends Religious Services:   . Active Member of Clubs or Organizations:   . Attends Archivist Meetings:   Marland Kitchen Marital Status:   Intimate Partner Violence:   . Fear of Current or Ex-Partner:   . Emotionally Abused:   Marland Kitchen Physically Abused:   . Sexually Abused:     PHYSICAL EXAM: Blood pressure (!) 152/83, pulse 77, height 5\' 9"  (1.753 m), weight 158 lb (71.7 kg), SpO2 96 %. General: No acute distress.  Patient appears well-groomed.   Head:  Normocephalic/atraumatic Eyes:  Fundi examined but not visualized Neck: supple, no paraspinal tenderness, full range of motion Heart:  Regular rate and rhythm Lungs:  Clear to auscultation bilaterally Back: No paraspinal tenderness Neurological Exam: alert and oriented to person, place, and time. Attention span and concentration intact, recent and remote memory intact, fund of knowledge intact.  Speech fluent and not dysarthric, language intact.  CN II-XII intact. Bulk and tone normal, muscle strength 5/5 throughout.  Sensation to light touch, temperature and vibration intact.  Deep tendon reflexes 2+ throughout, toes downgoing.  Finger to nose and heel to shin testing intact.  Gait normal, Romberg negative.  IMPRESSION: Brief transient confusion.  MRI and EEG unremarkable.  No real lateralizing symptoms to confidently say he had a transient ischemic attack.  I would still recommend taking ASA 81mg  daily  PLAN: 1.  ASA 81mg  daily 2.  Follow up as needed.  Metta Clines, DO  CC: Shon Baton, MD

## 2019-05-31 ENCOUNTER — Ambulatory Visit (INDEPENDENT_AMBULATORY_CARE_PROVIDER_SITE_OTHER): Payer: PPO | Admitting: Neurology

## 2019-05-31 ENCOUNTER — Encounter: Payer: Self-pay | Admitting: Neurology

## 2019-05-31 ENCOUNTER — Other Ambulatory Visit: Payer: Self-pay

## 2019-05-31 VITALS — BP 152/83 | HR 77 | Ht 69.0 in | Wt 158.0 lb

## 2019-05-31 DIAGNOSIS — R410 Disorientation, unspecified: Secondary | ICD-10-CM | POA: Diagnosis not present

## 2019-05-31 NOTE — Patient Instructions (Signed)
Take aspirin 81mg  daily Follow up as needed

## 2019-07-04 DIAGNOSIS — R1012 Left upper quadrant pain: Secondary | ICD-10-CM | POA: Diagnosis not present

## 2019-07-04 DIAGNOSIS — K219 Gastro-esophageal reflux disease without esophagitis: Secondary | ICD-10-CM | POA: Diagnosis not present

## 2019-07-18 DIAGNOSIS — H35373 Puckering of macula, bilateral: Secondary | ICD-10-CM | POA: Diagnosis not present

## 2019-07-18 DIAGNOSIS — H33312 Horseshoe tear of retina without detachment, left eye: Secondary | ICD-10-CM | POA: Diagnosis not present

## 2019-07-18 DIAGNOSIS — Z961 Presence of intraocular lens: Secondary | ICD-10-CM | POA: Diagnosis not present

## 2019-07-18 DIAGNOSIS — H348312 Tributary (branch) retinal vein occlusion, right eye, stable: Secondary | ICD-10-CM | POA: Diagnosis not present

## 2019-07-18 DIAGNOSIS — H353132 Nonexudative age-related macular degeneration, bilateral, intermediate dry stage: Secondary | ICD-10-CM | POA: Diagnosis not present

## 2019-08-24 DIAGNOSIS — R413 Other amnesia: Secondary | ICD-10-CM | POA: Diagnosis not present

## 2019-10-28 ENCOUNTER — Emergency Department (HOSPITAL_COMMUNITY)
Admission: EM | Admit: 2019-10-28 | Discharge: 2019-10-29 | Disposition: A | Discharge status: Home / Self Care | Payer: PPO | Attending: Emergency Medicine | Admitting: Emergency Medicine

## 2019-10-28 ENCOUNTER — Other Ambulatory Visit: Payer: Self-pay

## 2019-10-28 ENCOUNTER — Encounter (HOSPITAL_COMMUNITY): Payer: Self-pay | Admitting: Emergency Medicine

## 2019-10-28 DIAGNOSIS — R001 Bradycardia, unspecified: Secondary | ICD-10-CM | POA: Insufficient documentation

## 2019-10-28 DIAGNOSIS — R519 Headache, unspecified: Secondary | ICD-10-CM | POA: Insufficient documentation

## 2019-10-28 DIAGNOSIS — R55 Syncope and collapse: Secondary | ICD-10-CM | POA: Insufficient documentation

## 2019-10-28 DIAGNOSIS — R42 Dizziness and giddiness: Secondary | ICD-10-CM | POA: Insufficient documentation

## 2019-10-28 DIAGNOSIS — Z8546 Personal history of malignant neoplasm of prostate: Secondary | ICD-10-CM | POA: Diagnosis not present

## 2019-10-28 DIAGNOSIS — J449 Chronic obstructive pulmonary disease, unspecified: Secondary | ICD-10-CM | POA: Insufficient documentation

## 2019-10-28 NOTE — ED Triage Notes (Signed)
Patient reports brief head/chest pressure at home this evening , symptoms resolved at triage , denies chest pain / no SOB .

## 2019-10-29 DIAGNOSIS — R519 Headache, unspecified: Secondary | ICD-10-CM | POA: Diagnosis not present

## 2019-10-29 DIAGNOSIS — R55 Syncope and collapse: Secondary | ICD-10-CM | POA: Diagnosis not present

## 2019-10-29 LAB — CBC WITH DIFFERENTIAL/PLATELET
Abs Immature Granulocytes: 0.03 10*3/uL (ref 0.00–0.07)
Basophils Absolute: 0.1 10*3/uL (ref 0.0–0.1)
Basophils Relative: 1 %
Eosinophils Absolute: 0.2 10*3/uL (ref 0.0–0.5)
Eosinophils Relative: 4 %
HCT: 38.4 % — ABNORMAL LOW (ref 39.0–52.0)
Hemoglobin: 12.4 g/dL — ABNORMAL LOW (ref 13.0–17.0)
Immature Granulocytes: 1 %
Lymphocytes Relative: 19 %
Lymphs Abs: 1.3 10*3/uL (ref 0.7–4.0)
MCH: 30.2 pg (ref 26.0–34.0)
MCHC: 32.3 g/dL (ref 30.0–36.0)
MCV: 93.4 fL (ref 80.0–100.0)
Monocytes Absolute: 0.7 10*3/uL (ref 0.1–1.0)
Monocytes Relative: 10 %
Neutro Abs: 4.3 10*3/uL (ref 1.7–7.7)
Neutrophils Relative %: 65 %
Platelets: 209 10*3/uL (ref 150–400)
RBC: 4.11 MIL/uL — ABNORMAL LOW (ref 4.22–5.81)
RDW: 14 % (ref 11.5–15.5)
WBC: 6.6 10*3/uL (ref 4.0–10.5)
nRBC: 0 % (ref 0.0–0.2)

## 2019-10-29 LAB — BASIC METABOLIC PANEL
Anion gap: 10 (ref 5–15)
BUN: 19 mg/dL (ref 8–23)
CO2: 21 mmol/L — ABNORMAL LOW (ref 22–32)
Calcium: 8.8 mg/dL — ABNORMAL LOW (ref 8.9–10.3)
Chloride: 106 mmol/L (ref 98–111)
Creatinine, Ser: 0.98 mg/dL (ref 0.61–1.24)
GFR, Estimated: >60 mL/min (ref >60)
Glucose, Bld: 112 mg/dL — ABNORMAL HIGH (ref 70–99)
Potassium: 4.1 mmol/L (ref 3.5–5.1)
Sodium: 137 mmol/L (ref 135–145)

## 2019-10-29 LAB — TROPONIN I (HIGH SENSITIVITY)
Troponin I (High Sensitivity): 7 ng/L (ref <18)
Troponin I (High Sensitivity): 7 ng/L (ref <18)

## 2019-10-29 NOTE — ED Provider Notes (Signed)
[facility] EMERGENCY DEPARTMENT Provider Note  CSN: [id number] Arrival date & time: 10/28/19 2346  Chief Complaint(s) Head/Chest Pressure  HPI Dennis Reynolds is a 86 y.o. male   The history is provided by the patient.  Near Syncope This is a new problem. The current episode started 6 to 12 hours ago. Episode frequency: once. The problem has been resolved. Associated symptoms include headaches. Pertinent negatives include no chest pain, no abdominal pain and no shortness of breath. Nothing aggravates the symptoms. Nothing relieves the symptoms. He has tried nothing for the symptoms. The treatment provided no relief.   Patient reports symptom onset while sitting watching TV.  Reports feeling like his head was filling up.  He reports associated lightheadedness.  Symptoms lasted only few seconds to a minute.  Resolved spontaneously.  Has not recurred since.  No recent fevers or infections.  No coughing or congestion.  No nausea vomiting.  No abdominal pain or chest pain.  No diarrhea.   Past Medical History Past Medical History:  Diagnosis Date  . Arthritis   . BPH (benign prostatic hyperplasia)   . COPD (chronic obstructive pulmonary disease) ([place])   . Diverticulosis   . Duodenal ulcer due to Helicobacter pylori   . ED (erectile dysfunction)   . GERD (gastroesophageal reflux disease)   . Hyperlipidemia   . Internal hemorrhoid   . Neuropathy    LLE  . Osteopenia   . Otitis media    chronic  . Prostate cancer ([place]) 2003  . Radiation proctitis   . Retinal tear    x 2  . Sinus bradycardia   . Stricture and stenosis of esophagus   . Vitamin D deficiency    Patient Active Problem List   Diagnosis Date Noted  . IBS (irritable bowel syndrome) 07/02/2011  . GERD with stricture 07/02/2011  . Radiation proctitis 04/23/2011   Home Medication(s) Prior to Admission medications   Medication Sig Start Date End Date Taking? Authorizing Provider  alendronate  (FOSAMAX) 70 MG tablet Take 70 mg by mouth once a week. Take with a full glass of water on an empty stomach.   Yes [provider]  CALCIUM-VITAMIN D PO Take 1 tablet by mouth daily.   Yes [provider]  Flaxseed, Linseed, (FLAX SEED OIL) 1000 MG CAPS Take 1 capsule by mouth daily.   Yes [provider]  Multiple Vitamin (MULTIVITAMIN) capsule Take 1 capsule by mouth daily.   Yes [provider]  Multiple Vitamins-Minerals (PRESERVISION AREDS 2+MULTI VIT PO) Take 1 capsule by mouth daily.   Yes [provider]  vitamin E 200 UNIT capsule Take 400 Units by mouth daily.   Yes [provider]                                                                                                                                    Past Surgical  History Past Surgical History:  Procedure Laterality Date  . COLONOSCOPY     multiple  . ESOPHAGOGASTRODUODENOSCOPY    . [place]   right  . INNER EAR SURGERY     right  . OTHER SURGICAL HISTORY     radiation for prostate  . PROSTATECTOMY    . RETINAL DETACHMENT SURGERY     x 2, left  . TONSILLECTOMY AND ADENOIDECTOMY    . VASECTOMY     Family History Family History  Problem Relation Age of Onset  . Colon cancer Sister   . Diabetes Sister   . Heart disease Mother   . Prostate cancer Father   . Parkinsonism Father     Social History Social History   Tobacco Use  . Smoking status: Never Smoker  . Smokeless tobacco: Never Used  Vaping Use  . Vaping Use: Never used  Substance Use Topics  . Alcohol use: No  . Drug use: No   Allergies Patient has no known allergies.  Review of Systems Review of Systems  Respiratory: Negative for shortness of breath.   Cardiovascular: Positive for near-syncope. Negative for chest pain.  Gastrointestinal: Negative for abdominal pain.  Neurological: Positive for headaches.   All other systems are reviewed and are negative for acute  change except as noted in the HPI  Physical Exam Vital Signs  I have reviewed the triage vital signs BP (!) 141/85   Pulse (!) 52   Temp 97.8 F (36.6 C) (Oral)   Resp 18   Ht 5\' 9"  (1.753 m)   Wt 90 kg   SpO2 97%   BMI 29.30 kg/m   Physical Exam Vitals reviewed.  Constitutional:      General: He is not in acute distress.    Appearance: He is well-developed. He is not diaphoretic.  HENT:     Head: Normocephalic and atraumatic.     Nose: Nose normal.  Eyes:     General: No scleral icterus.       Right eye: No discharge.        Left eye: No discharge.     Conjunctiva/sclera: Conjunctivae normal.     Pupils: Pupils are equal, round, and reactive to light.  Cardiovascular:     Rate and Rhythm: Regular rhythm. Bradycardia present.     Heart sounds: No murmur heard.  No friction rub. No gallop.   Pulmonary:     Effort: Pulmonary effort is normal. No respiratory distress.     Breath sounds: Normal breath sounds. No stridor. No rales.  Abdominal:     General: There is no distension.     Palpations: Abdomen is soft.     Tenderness: There is no abdominal tenderness.  Musculoskeletal:        General: No tenderness.     Cervical back: Normal range of motion and neck supple.  Skin:    General: Skin is warm and dry.     Findings: No erythema or rash.  Neurological:     Mental Status: He is alert and oriented to person, place, and time.     ED Results and Treatments Labs (all labs ordered are listed, but only abnormal results are displayed) Labs Reviewed  CBC WITH DIFFERENTIAL/PLATELET - Abnormal; Notable for the following components:      Result Value   RBC 4.11 (*)    Hemoglobin 12.4 (*)    HCT 38.4 (*)    All other components within normal limits  BASIC  METABOLIC PANEL - Abnormal; Notable for the following components:   CO2 21 (*)    Glucose, Bld 112 (*)    Calcium 8.8 (*)    All other components within normal limits  TROPONIN I (HIGH SENSITIVITY)  TROPONIN I  (HIGH SENSITIVITY)                                                                                                                         EKG  EKG Interpretation  Date/Time:  Sunday October 28 2019 23:57:40 EDT Ventricular Rate:  56 PR Interval:  178 QRS Duration: 96 QT Interval:  426 QTC Calculation: 411 R Axis:   4 Text Interpretation: Sinus bradycardia Cannot rule out Anterior infarct , age undetermined Abnormal ECG No acute changes Confirmed by Addison Lank [phone]) on 10/29/2019 12:04:16 AM      Radiology No results found.  Pertinent labs & imaging results that were available during my care of the patient were reviewed by me and considered in my medical decision making (see chart for details).  Medications Ordered in ED Medications - No data to display                                                                                                                                  Procedures Procedures  (including critical care time)  Medical Decision Making / ED Course I have reviewed the nursing notes for this encounter and the patient's prior records (if available in EHR or on provided paperwork).   Dennis Reynolds was evaluated in Emergency Department on 10/29/2019 for the symptoms described in the history of present illness. He was evaluated in the context of the global COVID-19 pandemic, which necessitated consideration that the patient might be at risk for infection with the SARS-CoV-2 virus that causes COVID-19. Institutional protocols and algorithms that pertain to the evaluation of patients at risk for COVID-19 are in a state of rapid change based on information released by regulatory bodies including the CDC and federal and state organizations. These policies and algorithms were followed during the patient's care in the ED.  Near syncopal symptoms. EKG with sinus bradycardia, no significant ischemic changes, dysrhythmias, blocks. Patient was noted to be  tachycardic initially but now in sinus bradycardia. Patient denies any history of dysrhythmias.  Denies any palpitations.  Currently asymptomatic.  Labs grossly reassuring without leukocytosis or anemia.  No significant electrolyte derangements  or renal sufficiency.  Serial tropes negative. Orthostatics were negative.  Patient monitored for several hours without recurrence of symptoms.      Final Clinical Impression(s) / ED Diagnoses Final diagnoses:  Pressure in head  Near syncope   The patient appears reasonably screened and/or stabilized for discharge and I doubt any other medical condition or other [facility] requiring further screening, evaluation, or treatment in the ED at this time prior to discharge. Safe for discharge with strict return precautions.  Disposition: Discharge  Condition: Good  I have discussed the results, Dx and Tx plan with the patient/family who expressed understanding and agree(s) with the plan. Discharge instructions discussed at length. The patient/family was given strict return precautions who verbalized understanding of the instructions. No further questions at time of discharge.    ED Discharge Orders    None       Follow Up: Shon Baton, MD [place] [id number] [phone]  Call  To schedule an appointment for close follow up  [place] [id number] [phone] Schedule an appointment as soon as possible for a visit        This chart was dictated using voice recognition software.  Despite best efforts to proofread,  errors can occur which can change the documentation meaning.   Fatima Blank, MD 10/29/19 [phone]

## 2019-10-29 NOTE — ED Notes (Signed)
Pt verbalizes understanding of d/c instructions. Pt ambulatory at d/c with all belongings.   

## 2019-12-05 DIAGNOSIS — R911 Solitary pulmonary nodule: Secondary | ICD-10-CM

## 2019-12-05 HISTORY — DX: Solitary pulmonary nodule: R91.1

## 2019-12-10 ENCOUNTER — Other Ambulatory Visit: Payer: Self-pay

## 2019-12-10 ENCOUNTER — Encounter (HOSPITAL_COMMUNITY): Payer: Self-pay | Admitting: Emergency Medicine

## 2019-12-10 ENCOUNTER — Emergency Department (HOSPITAL_COMMUNITY): Payer: PPO

## 2019-12-10 ENCOUNTER — Emergency Department (HOSPITAL_COMMUNITY)
Admission: EM | Admit: 2019-12-10 | Discharge: 2019-12-10 | Disposition: A | Discharge status: Home / Self Care | Payer: PPO | Attending: Emergency Medicine | Admitting: Emergency Medicine

## 2019-12-10 DIAGNOSIS — N132 Hydronephrosis with renal and ureteral calculous obstruction: Secondary | ICD-10-CM | POA: Diagnosis not present

## 2019-12-10 DIAGNOSIS — R109 Unspecified abdominal pain: Secondary | ICD-10-CM | POA: Diagnosis present

## 2019-12-10 DIAGNOSIS — Z8546 Personal history of malignant neoplasm of prostate: Secondary | ICD-10-CM | POA: Diagnosis not present

## 2019-12-10 DIAGNOSIS — J449 Chronic obstructive pulmonary disease, unspecified: Secondary | ICD-10-CM | POA: Diagnosis not present

## 2019-12-10 DIAGNOSIS — N201 Calculus of ureter: Secondary | ICD-10-CM | POA: Diagnosis not present

## 2019-12-10 DIAGNOSIS — K5730 Diverticulosis of large intestine without perforation or abscess without bleeding: Secondary | ICD-10-CM | POA: Diagnosis not present

## 2019-12-10 DIAGNOSIS — N281 Cyst of kidney, acquired: Secondary | ICD-10-CM | POA: Diagnosis not present

## 2019-12-10 DIAGNOSIS — K449 Diaphragmatic hernia without obstruction or gangrene: Secondary | ICD-10-CM | POA: Diagnosis not present

## 2019-12-10 LAB — URINALYSIS, ROUTINE W REFLEX MICROSCOPIC
Bacteria, UA: NONE SEEN
Bilirubin Urine: NEGATIVE
Glucose, UA: NEGATIVE mg/dL
Ketones, ur: NEGATIVE mg/dL
Leukocytes,Ua: NEGATIVE
Nitrite: NEGATIVE
Protein, ur: NEGATIVE mg/dL
Specific Gravity, Urine: 1.018 (ref 1.005–1.030)
pH: 5 (ref 5.0–8.0)

## 2019-12-10 LAB — CBC WITH DIFFERENTIAL/PLATELET
Abs Immature Granulocytes: 0.03 10*3/uL (ref 0.00–0.07)
Basophils Absolute: 0 10*3/uL (ref 0.0–0.1)
Basophils Relative: 0 %
Eosinophils Absolute: 0.1 10*3/uL (ref 0.0–0.5)
Eosinophils Relative: 1 %
HCT: 41.4 % (ref 39.0–52.0)
Hemoglobin: 13.3 g/dL (ref 13.0–17.0)
Immature Granulocytes: 0 %
Lymphocytes Relative: 10 %
Lymphs Abs: 1 10*3/uL (ref 0.7–4.0)
MCH: 30 pg (ref 26.0–34.0)
MCHC: 32.1 g/dL (ref 30.0–36.0)
MCV: 93.2 fL (ref 80.0–100.0)
Monocytes Absolute: 0.6 10*3/uL (ref 0.1–1.0)
Monocytes Relative: 6 %
Neutro Abs: 8.1 10*3/uL — ABNORMAL HIGH (ref 1.7–7.7)
Neutrophils Relative %: 83 %
Platelets: 249 10*3/uL (ref 150–400)
RBC: 4.44 MIL/uL (ref 4.22–5.81)
RDW: 13.7 % (ref 11.5–15.5)
WBC: 9.8 10*3/uL (ref 4.0–10.5)
nRBC: 0 % (ref 0.0–0.2)

## 2019-12-10 LAB — COMPREHENSIVE METABOLIC PANEL
ALT: 17 U/L (ref 0–44)
AST: 24 U/L (ref 15–41)
Albumin: 3.7 g/dL (ref 3.5–5.0)
Alkaline Phosphatase: 44 U/L (ref 38–126)
Anion gap: 12 (ref 5–15)
BUN: 15 mg/dL (ref 8–23)
CO2: 21 mmol/L — ABNORMAL LOW (ref 22–32)
Calcium: 9.1 mg/dL (ref 8.9–10.3)
Chloride: 105 mmol/L (ref 98–111)
Creatinine, Ser: 1.19 mg/dL (ref 0.61–1.24)
GFR, Estimated: 59 mL/min — ABNORMAL LOW (ref >60)
Glucose, Bld: 149 mg/dL — ABNORMAL HIGH (ref 70–99)
Potassium: 4 mmol/L (ref 3.5–5.1)
Sodium: 138 mmol/L (ref 135–145)
Total Bilirubin: 1 mg/dL (ref 0.3–1.2)
Total Protein: 6.6 g/dL (ref 6.5–8.1)

## 2019-12-10 LAB — LIPASE, BLOOD: Lipase: 31 U/L (ref 11–51)

## 2019-12-10 MED ORDER — TAMSULOSIN HCL 0.4 MG PO CAPS: 0.4000 mg | ORAL_CAPSULE | Freq: Every day | ORAL | 0 refills | Status: DC

## 2019-12-10 MED ORDER — ONDANSETRON 4 MG PO TBDP: 4.0000 mg | ORAL_TABLET | Freq: Three times a day (TID) | ORAL | 0 refills | Status: DC | PRN

## 2019-12-10 MED ORDER — IOHEXOL 300 MG/ML  SOLN
100.0000 mL | Freq: Once | INTRAMUSCULAR | Status: AC | PRN
  Administered 2019-12-10: 100 mL via INTRAVENOUS

## 2019-12-10 MED ORDER — HYDROCODONE-ACETAMINOPHEN 5-325 MG PO TABS: 1.0000 | ORAL_TABLET | Freq: Four times a day (QID) | ORAL | 0 refills | Status: DC | PRN

## 2019-12-10 NOTE — ED Provider Notes (Signed)
Lower left abd pain - intermittent - no sx at this time - denies hematuria or dysuria, no fevers, chills, on exam has soft and NT abd - CT shows distal ureteral stone 5X7 mm, needs urology f/u - has likely isolated distal stone - otherwise stable appearing.  I discussed the care with Dr. Jeffie Pollock of the urology service who knows this patient well and will have the office set up a follow-up appointment.  Medical screening examination/treatment/procedure(s) were conducted as a shared visit with non-physician practitioner(s) and myself.  I personally evaluated the patient during the encounter.  Clinical Impression:   Final diagnoses:  Left ureteral stone         Noemi Chapel, MD 12/11/19 [phone]

## 2019-12-10 NOTE — ED Triage Notes (Signed)
Pt here with c/o llq pain that started yesterday m no n/v/d or urinary symptoms

## 2019-12-10 NOTE — Discharge Instructions (Addendum)
Please follow closely with urology. You can take the pain medication as needed for more severe pain. Otherwise, take tylenol every 6 hours. Drink plenty of water. You can take Zofran every 8 hours as needed for nausea. Take flomax once per day for bladder spasm. Return to the ER for fever, chills, uncontrollable vomiting, or worsening symptoms. Follow with your primary care to discuss your incidental CT findings.

## 2019-12-10 NOTE — ED Provider Notes (Signed)
[facility] EMERGENCY DEPARTMENT Provider Note   CSN: [id number] Arrival date & time: 12/10/19  3419     History Chief Complaint  Patient presents with  . Abdominal Pain    Dennis Reynolds is a 86 y.o. male past medical history of prostate cancer status post prostatectomy, diverticulosis, COPD, IBS, presenting to the emergency department with complaint of left sided abdominal pain that began last night.  Pain is a constant pain that is mild in severity.  He states he has never had this pain before and it was concerning him last night therefore he presents to the ED for evaluation.  He has no associated nausea, diarrhea, constipation or urinary symptoms.  He has documented history of diverticulosis without history of diverticulitis.  No recent diet changes, no fevers.  He has history of prostatectomy from prostate cancer that was not metastatic.  The history is provided by the patient.       Past Medical History:  Diagnosis Date  . Arthritis   . BPH (benign prostatic hyperplasia)   . COPD (chronic obstructive pulmonary disease) ([place])   . Diverticulosis   . Duodenal ulcer due to Helicobacter pylori   . ED (erectile dysfunction)   . GERD (gastroesophageal reflux disease)   . Hyperlipidemia   . Internal hemorrhoid   . Neuropathy    LLE  . Osteopenia   . Otitis media    chronic  . Prostate cancer ([place]) 2003  . Radiation proctitis   . Retinal tear    x 2  . Sinus bradycardia   . Stricture and stenosis of esophagus   . Vitamin D deficiency     Patient Active Problem List   Diagnosis Date Noted  . IBS (irritable bowel syndrome) 07/02/2011  . GERD with stricture 07/02/2011  . Radiation proctitis 04/23/2011    Past Surgical History:  Procedure Laterality Date  . COLONOSCOPY     multiple  . ESOPHAGOGASTRODUODENOSCOPY    . [place]   right  . INNER EAR SURGERY     right  . OTHER SURGICAL HISTORY     radiation for prostate  .  PROSTATECTOMY    . RETINAL DETACHMENT SURGERY     x 2, left  . TONSILLECTOMY AND ADENOIDECTOMY    . VASECTOMY         Family History  Problem Relation Age of Onset  . Colon cancer Sister   . Diabetes Sister   . Heart disease Mother   . Prostate cancer Father   . Parkinsonism Father     Social History   Tobacco Use  . Smoking status: Never Smoker  . Smokeless tobacco: Never Used  Vaping Use  . Vaping Use: Never used  Substance Use Topics  . Alcohol use: No  . Drug use: No    Home Medications Prior to Admission medications   Medication Sig Start Date End Date Taking? Authorizing Provider  alendronate (FOSAMAX) 70 MG tablet Take 70 mg by mouth once a week. Take with a full glass of water on an empty stomach.   Yes [provider]  aspirin EC 325 MG tablet Take 325 mg by mouth every 4 (four) hours as needed for mild pain.   Yes [provider]  HYDROcodone-acetaminophen (NORCO/VICODIN) 5-325 MG tablet Take 1 tablet by mouth every 6 (six) hours as needed for severe pain. 12/10/19   Calani Gick, [place] N, PA-C  ondansetron (ZOFRAN ODT) 4 MG disintegrating tablet Take 1  tablet (4 mg total) by mouth every 8 (eight) hours as needed for nausea or vomiting. 12/10/19   Laraina Sulton, [place] N, PA-C  tamsulosin (FLOMAX) 0.4 MG CAPS capsule Take 1 capsule (0.4 mg total) by mouth daily. 12/10/19   Dorman Calderwood, [place] N, PA-C    Allergies    Patient has no known allergies.  Review of Systems   Review of Systems  Gastrointestinal: Positive for abdominal pain.  All other systems reviewed and are negative.   Physical Exam Updated Vital Signs BP 113/77   Pulse 72   Temp 98.5 F (36.9 C) (Oral)   Resp 19   SpO2 99%   Physical Exam Vitals and nursing note reviewed.  Constitutional:      General: He is not in acute distress.    Appearance: He is well-developed.  HENT:     Head: Normocephalic and atraumatic.     Mouth/Throat:     Mouth: Mucous membranes are moist.   Eyes:     Conjunctiva/sclera: Conjunctivae normal.  Cardiovascular:     Rate and Rhythm: Normal rate and regular rhythm.  Pulmonary:     Effort: Pulmonary effort is normal.     Breath sounds: Normal breath sounds.  Abdominal:     General: Abdomen is flat. Bowel sounds are normal.     Palpations: Abdomen is soft.     Tenderness: There is abdominal tenderness in the left lower quadrant. There is no guarding or rebound.  Skin:    General: Skin is warm.  Neurological:     Mental Status: He is alert.  Psychiatric:        Behavior: Behavior normal.     ED Results / Procedures / Treatments   Labs (all labs ordered are listed, but only abnormal results are displayed) Labs Reviewed  COMPREHENSIVE METABOLIC PANEL - Abnormal; Notable for the following components:      Result Value   CO2 21 (*)    Glucose, Bld 149 (*)    GFR, Estimated 59 (*)    All other components within normal limits  CBC WITH DIFFERENTIAL/PLATELET - Abnormal; Notable for the following components:   Neutro Abs 8.1 (*)    All other components within normal limits  URINALYSIS, ROUTINE W REFLEX MICROSCOPIC - Abnormal; Notable for the following components:   Hgb urine dipstick MODERATE (*)    All other components within normal limits  LIPASE, BLOOD    EKG None  Radiology CT Abdomen Pelvis W Contrast  Result Date: 12/10/2019 CLINICAL DATA:  Left lower quadrant pain for 1 day EXAM: CT ABDOMEN AND PELVIS WITH CONTRAST TECHNIQUE: Multidetector CT imaging of the abdomen and pelvis was performed using the standard protocol following bolus administration of intravenous contrast. CONTRAST:  116mL OMNIPAQUE IOHEXOL 300 MG/ML  SOLN COMPARISON:  Ultrasound 10/13/2009 FINDINGS: Lower chest: Rounded 9 mm noncalcified pulmonary nodule within the lateral aspect of the left lower lobe (series 5, image 16). Included lung bases otherwise clear. Heart size is normal. Hepatobiliary: 12 mm hepatic cyst within the caudate lobe.  Otherwise, no focal liver abnormality is seen. No gallstones, gallbladder wall thickening, or biliary dilatation. Pancreas: Unremarkable. No pancreatic ductal dilatation or surrounding inflammatory changes. Spleen: Normal in size without focal abnormality. Adrenals/Urinary Tract: Unremarkable adrenal glands. Obstructing 7 x 5 mm stone within the distal left ureter along the pelvic sidewall resulting in mild left hydronephrosis. There is an additional 8 mm ovoid stone within the lower pole of the left kidney. 3.9 cm left renal sinus cyst. Additional  2.4 cm exophytic hyperdense lesion at the inferior pole of the left kidney, likely cysts complicated by proteinaceous or hemorrhagic contents. Right kidney is unremarkable without stone or hydronephrosis. Urinary bladder within normal limits. Stomach/Bowel: Small hiatal hernia. Stomach is otherwise within normal limits. Appendix appears normal (series 3, image 60). Minimal scattered sigmoid diverticulosis. No evidence of bowel wall thickening, distention, or inflammatory changes. Vascular/Lymphatic: Scattered aortoiliac atherosclerotic calcifications without aneurysm. No abdominopelvic lymphadenopathy. Reproductive: Prostate gland is surgically absent. Other: No free fluid. No abdominopelvic fluid collection. No pneumoperitoneum. Prominent fat deposition within the retroperitoneum. No abdominal wall hernia. Musculoskeletal: Thoracolumbar scoliotic curvature with multilevel degenerative disc disease. No acute osseous findings. No suspicious bone lesions. IMPRESSION: 1. Obstructing 7 x 5 mm stone within the distal left ureter along the pelvic sidewall resulting in mild left hydronephrosis. 2. Additional 8 mm nonobstructing stone within the lower pole of the left kidney. 3. Hyperdense 2.4 cm left renal cyst (Bosniak II). 4. Rounded 9 mm noncalcified pulmonary nodule within the lateral aspect of the left lower lobe. Consider one of the following in 3 months for both low-risk  and high-risk individuals: (a) repeat chest CT, (b) follow-up PET-CT, or (c) tissue sampling. This recommendation follows the consensus statement: Guidelines for Management of Incidental Pulmonary Nodules Detected on CT Images: From the Fleischner Society 2017; Radiology 2017; 284:228-243. 5. Small hiatal hernia. 6. Aortic atherosclerosis. (ICD10-I70.0). Electronically Signed   By: Davina Poke D.O.   On: 12/10/2019 13:47    Procedures Procedures (including critical care time)  Medications Ordered in ED Medications  iohexol (OMNIPAQUE) 300 MG/ML solution 100 mL (100 mLs Intravenous Contrast Given 12/10/19 1324)    ED Course  I have reviewed the triage vital signs and the nursing notes.  Pertinent labs & imaging results that were available during my care of the patient were reviewed by me and considered in my medical decision making (see chart for details).  Clinical Course as of Dec 09 1629  Mon Dec 10, 2019  1523 Dr. Jeffie Pollock with urology consulted. Agreeable with plan for discharge with close outpatient follow up. Urology to arrange folllow up   [JR]    Clinical Course User Index [JR] Ossie Yebra, [place] N, PA-C   MDM Rules/Calculators/A&P                          Patient presenting with left-sided abdominal pain that began last night.  No other associated symptoms.  He has documented history of diverticulosis.  On exam he is well-appearing and in no distress.  Abdomen is soft with tenderness in the left lower quadrant, no guarding or rebound.  He is afebrile stable vital signs.  Labs obtained in triage with left shift, normal white count 9.8, lipase within normal limits, metabolic panel is unremarkable.  UA pending.  Will obtain CT scan to evaluate for diverticulitis or other acute intra-abdominal pathology.  Patient's symptoms are well managed at this time, does not require medication intervention.  CT scan with 7 x 5 mm in the distal left ureter with mild hydronephrosis.  Discussed all  other incidental CT findings with patient.  Patient states he was followed by Dr. Jeffie Pollock with [facility] many years back, he has never been diagnosed with stone.  His pain remains minimal to none, continues to deny any nausea or vomiting.  UA is negative for infection.  Will consult urology to ensure close follow-up, considering the size of stone and patient's age.  Dr. Jeffie Pollock  agrees with workup and care plan for outpatient follow up. Will prescribe medications for symptom management should he develop more pain.   Discussed results, findings, treatment and follow up. Patient advised of return precautions. Patient verbalized understanding and agreed with plan.  [place] Controlled Substance reporting System queried  Final Clinical Impression(s) / ED Diagnoses Final diagnoses:  Left ureteral stone    Rx / DC Orders ED Discharge Orders         Ordered    HYDROcodone-acetaminophen (NORCO/VICODIN) 5-325 MG tablet  Every 6 hours PRN        12/10/19 1535    tamsulosin (FLOMAX) 0.4 MG CAPS capsule  Daily        12/10/19 1535    ondansetron (ZOFRAN ODT) 4 MG disintegrating tablet  Every 8 hours PRN        12/10/19 1535           Aarish Rockers, [place] N, [place] 12/10/19 1631    Noemi Chapel, MD 12/11/19 [phone]

## 2019-12-12 DIAGNOSIS — N202 Calculus of kidney with calculus of ureter: Secondary | ICD-10-CM | POA: Diagnosis not present

## 2019-12-12 DIAGNOSIS — N281 Cyst of kidney, acquired: Secondary | ICD-10-CM | POA: Diagnosis not present

## 2019-12-12 DIAGNOSIS — N201 Calculus of ureter: Secondary | ICD-10-CM | POA: Diagnosis not present

## 2019-12-13 ENCOUNTER — Other Ambulatory Visit: Payer: Self-pay | Admitting: Urology

## 2019-12-13 DIAGNOSIS — N201 Calculus of ureter: Secondary | ICD-10-CM

## 2019-12-17 ENCOUNTER — Other Ambulatory Visit (HOSPITAL_COMMUNITY)
Admission: RE | Admit: 2019-12-17 | Discharge: 2019-12-17 | Disposition: A | Discharge status: Home / Self Care | Payer: PPO | Source: Ambulatory Visit | Attending: Urology | Admitting: Urology

## 2019-12-17 DIAGNOSIS — Z01812 Encounter for preprocedural laboratory examination: Secondary | ICD-10-CM | POA: Diagnosis not present

## 2019-12-17 DIAGNOSIS — Z20822 Contact with and (suspected) exposure to covid-19: Secondary | ICD-10-CM | POA: Diagnosis not present

## 2019-12-18 LAB — SARS CORONAVIRUS 2 (TAT 6-24 HRS): SARS Coronavirus 2: NEGATIVE

## 2019-12-18 NOTE — Progress Notes (Signed)
Patient to arrive at 1315 on 12/20/2019. History and medications reviewed. Pre-procedure instructions given. NPO after MN except for clear liquids until 1115. Driver secured.

## 2019-12-18 NOTE — H&P (Signed)
I have a history of kidney stones.  HPI: Dennis Reynolds is a 86 year-old male established patient who is here for F/U due to a history of renal calculi.  03/2017: Dennis Reynolds returns today for an overdue f/u for his history of stones, renal cysts and prostate cancer treated with RRP in 2003 and he had salvage Rtx in 2005. He had T3 disease. His PSA is <0.015 He has a history of small bilateral stones and a stable left renal cyst. He has had no hematuria. He has had no flank pain. He is voiding well with rare incontinence if he is not careful. He has persistent ED. He failed oral agents. He has no associated signs or symptoms.   12/12/2019: As needed follow-up recommended after last office visit assessment. Seen today as part of ED f/u for obstructing left ureteral calculi.  He presented to the emergency department for worsening left lower quadrant abdominal pain and discomfort that has been present for 1 day. He was mostly asymptomatic in the emergency department. CT imaging obtained did show an obstructing 5 x 7 mm mid to left distal ureteral calculi. Other incidental findings are annotated below.   Pt denies recurrence of pain/discomfort since hospital evaluation. Voiding symptoms remain stable at patient's baseline. He denies bothersome increase in frequency/urgency, changes in force of stream. He has not had any burning or painful urination, visible blood in the urine. He denies interval passage of stone material. He has not had any fevers or chills, nausea/vomiting. Patient was prescribed pain medication, zofran, and tamsulosin at time of ED discharge. He can't recall today if he started the tamsulosin or not.   Kidney function was within normal limits in the emergency department. He had no leukocytosis. Urinalysis on dipstick was positive for hematuria but not overly indicative of an underlying infectious process.   He did not pass a stone since the last office visit.   The patient has had flank  pain since they were last seen. He has no seen blood in his urine since the last visit. He is not currently having flank pain, back pain, groin pain, nausea, vomiting, fever or chills.     ALLERGIES: No Allergies    MEDICATIONS: Aspirin 81 MG TABS Oral  Multivitamin/Iron TABS Oral  Probiotic CAPS Oral  Vitamin D CAPS Oral     GU PSH: Extensive Prostate Surgery - 2009       [place] Notes: Repair Of Retinal Detachment By Laser, Prostatectomy Retropubic Radical With Lymph Node Biopsy(S), Radiation Therapy   NON-GU PSH: Repair Detached Retina - 2011     GU PMH: ED due to arterial insufficiency, Erectile dysfunction due to arterial insufficiency - 2017 History of prostate cancer, History of prostate cancer - 2017 Renal calculus, Renal stone - 2016 Renal cyst, Renal cyst, acquired, left - 2016 Gross hematuria, Gross hematuria - 2014 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), 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, Testicular atrophy - 2014 Hemorrhoids, Unspec, Hemorrhoids With Bleeding - 2014 Stress Incontinence, Male Stress Incontinence - 2014      PMH Notes:  2009-10-29 10:10:34 - Note: Retinal Tear  2006-02-01 10:04:05 - Note: Arthritis   NON-GU PMH: Encounter for general adult medical examination without abnormal findings, Encounter for preventive health examination - 2016 Decreased libido, Decreased libido - 2014 Personal history of other endocrine, nutritional and metabolic disease, History of hypercholesterolemia - 2014    FAMILY HISTORY: Cancer - Sister Death In The Family Father - Father Death In The Family Mother - Mother  Family Health Status Number - Runs In Family   SOCIAL HISTORY: Marital Status: Married Preferred Language: English; Ethnicity: Not Hispanic Or Latino; Race: White     Notes: Never A Smoker, Tobacco Use, Caffeine Use, Occupation:, Alcohol Use, Marital History - Currently Married   REVIEW OF SYSTEMS:    GU Review Male:   Patient denies frequent  urination, hard to postpone urination, burning/ pain with urination, get up at night to urinate, leakage of urine, stream starts and stops, trouble starting your stream, have to strain to urinate , erection problems, and penile pain.  Gastrointestinal (Upper):   Patient denies nausea, vomiting, and indigestion/ heartburn.  Gastrointestinal (Lower):   Patient denies diarrhea and constipation.  Constitutional:   Patient denies fever, night sweats, weight loss, and fatigue.  Skin:   Patient denies skin rash/ lesion and itching.  Eyes:   Patient denies blurred vision and double vision.  Ears/ Nose/ Throat:   Patient denies sore throat and sinus problems.  Hematologic/Lymphatic:   Patient denies swollen glands and easy bruising.  Cardiovascular:   Patient denies chest pains and leg swelling.  Respiratory:   Patient denies cough and shortness of breath.  Endocrine:   Patient denies excessive thirst.  Musculoskeletal:   Patient denies back pain and joint pain.  Neurological:   Patient denies headaches and dizziness.  Psychologic:   Patient denies depression and anxiety.   VITAL SIGNS:      12/12/2019 01:20 PM  Weight 150 lb / 68.04 kg  BP 130/83 mmHg  Pulse 63 /min  Temperature 98.2 F / 36.7 C   MULTI-SYSTEM PHYSICAL EXAMINATION:    Constitutional: Well-nourished. No physical deformities. Normally developed. Good grooming.  Neck: Neck symmetrical, not swollen. Normal tracheal position.  Respiratory: No labored breathing, no use of accessory muscles.   Cardiovascular: Normal temperature, normal extremity pulses, no swelling, no varicosities.  Skin: No paleness, no jaundice, no cyanosis. No lesion, no ulcer, no rash.  Neurologic / Psychiatric: Oriented to time, oriented to place, oriented to person. No depression, no anxiety, no agitation.  Gastrointestinal: No mass, no tenderness, no rigidity, non obese abdomen. No CVA or flank tenderness.  Musculoskeletal: Normal gait and station of head and  neck.     Complexity of Data:  Source Of History:  Patient, Medical Record Summary  Records Review:   Previous Doctor Records, Previous Hospital Records, Previous Patient Records  Urine Test Review:   Urinalysis  X-Ray Review: KUB: Reviewed Films. Discussed With Patient.  C.T. Abdomen/Pelvis: Reviewed Films. Reviewed Report. Discussed With Patient.     03/23/17 03/17/16 01/22/15 01/17/14 11/29/12 11/03/11 11/03/10 10/22/09  PSA  Total PSA <0.015 ng/mL < 0.015 ng/dl <0.01  <0.01  <0.01  <0.01  0.01  0.00    Notes:                     CLINICAL DATA: Left lower quadrant pain for 1 day   EXAM:  CT ABDOMEN AND PELVIS WITH CONTRAST   TECHNIQUE:  Multidetector CT imaging of the abdomen and pelvis was performed  using the standard protocol following bolus administration of  intravenous contrast.   CONTRAST: 154mL OMNIPAQUE IOHEXOL 300 MG/ML SOLN   COMPARISON: Ultrasound 10/13/2009   FINDINGS:  Lower chest: Rounded 9 mm noncalcified pulmonary nodule within the  lateral aspect of the left lower lobe (series 5, image 16). Included  lung bases otherwise clear. Heart size is normal.   Hepatobiliary: 12 mm hepatic cyst within the caudate lobe.  Otherwise, no focal liver abnormality is seen. No gallstones,  gallbladder wall thickening, or biliary dilatation.   Pancreas: Unremarkable. No pancreatic ductal dilatation or  surrounding inflammatory changes.   Spleen: Normal in size without focal abnormality.   Adrenals/Urinary Tract: Unremarkable adrenal glands.   Obstructing 7 x 5 mm stone within the distal left ureter along the  pelvic sidewall resulting in mild left hydronephrosis. There is an  additional 8 mm ovoid stone within the lower pole of the left  kidney. 3.9 cm left renal sinus cyst. Additional 2.4 cm exophytic  hyperdense lesion at the inferior pole of the left kidney, likely  cysts complicated by proteinaceous or hemorrhagic contents.   Right kidney is unremarkable  without stone or hydronephrosis.  Urinary bladder within normal limits.   Stomach/Bowel: Small hiatal hernia. Stomach is otherwise within  normal limits. Appendix appears normal (series 3, image 60). Minimal  scattered sigmoid diverticulosis. No evidence of bowel wall  thickening, distention, or inflammatory changes.   Vascular/Lymphatic: Scattered aortoiliac atherosclerotic  calcifications without aneurysm. No abdominopelvic lymphadenopathy.   Reproductive: Prostate gland is surgically absent.   Other: No free fluid. No abdominopelvic fluid collection. No  pneumoperitoneum. Prominent fat deposition within the  retroperitoneum. No abdominal wall hernia.   Musculoskeletal: Thoracolumbar scoliotic curvature with multilevel  degenerative disc disease. No acute osseous findings. No suspicious  bone lesions.   IMPRESSION:  1. Obstructing 7 x 5 mm stone within the distal left ureter along  the pelvic sidewall resulting in mild left hydronephrosis.  2. Additional 8 mm nonobstructing stone within the lower pole of the  left kidney.  3. Hyperdense 2.4 cm left renal cyst (Bosniak II).  4. Rounded 9 mm noncalcified pulmonary nodule within the lateral  aspect of the left lower lobe. Consider one of the following in 3  months for both low-risk and high-risk individuals: (a) repeat chest  CT, (b) follow-up PET-CT, or (c) tissue sampling. This  recommendation follows the consensus statement: Guidelines for  Management of Incidental Pulmonary Nodules Detected on CT Images:  From the Fleischner Society 2017; Radiology 2017; 284:228-243.  5. Small hiatal hernia.  6. Aortic atherosclerosis. (ICD10-I70.0).    Electronically Signed  By: Davina Poke D.O.  On: 12/10/2019 13:47   PROCEDURES:         KUB - [id number]  A single view of the abdomen is obtained. The left renal shadow is visualized. A nonobstructing calculus is noted in the left lower pole area. Tracing down the anatomical expected  tract of the left ureter and opacity consistent in size/shape of the previously identified distal left ureteral calculi noted on CT imaging remains in place lateral to the left sacral wing. No other obvious ureteral or bladder calculi noted on today's exam. Bowel gas pattern appears within normal limits. There are noted degenerative changes along the lumbar spine.      Patient confirmed No Neulasta OnPro Device.           Urinalysis w/Scope Dipstick Dipstick Cont'd Micro  Color: Yellow Bilirubin: Neg mg/dL WBC/hpf: 0 - 5/hpf  Appearance: Slightly Cloudy Ketones: Neg mg/dL RBC/hpf: 20 - 40/hpf  Specific Gravity: 1.025 Blood: 3+ ery/uL Bacteria: Few (10-25/hpf)  pH: <=5.0 Protein: Trace mg/dL Cystals: NS (Not Seen)  Glucose: Neg mg/dL Urobilinogen: 0.2 mg/dL Casts: NS (Not Seen)    Nitrites: Neg Trichomonas: Not Present    Leukocyte Esterase: Neg leu/uL Mucous: Present      Epithelial Cells: 0 - 5/hpf      Yeast:  NS (Not Seen)      Sperm: Not Present    ASSESSMENT:      ICD-10 Details  1 GU:   Ureteral calculus - N20.1 Left, Undiagnosed New Problem  2   Ureteral obstruction secondary to calculous - N13.2 Left, Undiagnosed New Problem  3   Renal calculus - N20.0 Left, Chronic, Stable  4   Renal cyst - N28.1 Left, Chronic, Stable - He has a stable appearing Bosniak II cyst in the left kidney. This is a known finding.   PLAN:            Medications New Meds: Tamsulosin Hcl 0.4 mg capsule 1 capsule PO Daily   #30  2 Refill(s)            Orders Labs Urine Culture  X-Rays: KUB          Schedule Return Visit/Planned Activity: Other See Visit Notes - Follow up MD, Schedule Surgery          Document Letter(s):  Created for Patient: Clinical Summary         Notes:   Left distal ureteral calculi identified on CT imaging performed in the emergency department a few days ago is easily identified within the pelvic ring today's KUB study. Fortunately the patient remains without  symptom. Urinalysis does have microscopic hematuria which correlates with today's imaging findings.   For observation I described the risks which include but are not limited to silent renal damage, life-threatening infection, need for emergent surgery, failure to pass stone, and pain.   For ureteroscopy I described the risks which include heart attack, stroke, pulmonary embolus, death, bleeding, infection, damage to contiguous structures, positioning injury, ureteral stricture, ureteral avulsion, ureteral injury, need for ureteral stent, inability to perform ureteroscopy, need for an interval procedure, inability to clear stone burden, stent discomfort and pain.   For shockwave lithotripsy I described the risks which include arrhythmia, kidney contusion, kidney hemorrhage, need for transfusion, long-term risk of diabetes or hypertension, back discomfort, flank ecchymosis, flank abrasion, inability to break up stone, inability to pass stone fragments, Steinstrasse, infection associated with obstructing stones, need for different surgical procedure and possible need for repeat shockwave lithotripsy.   I have sent a message to Dr Jeffie Pollock regarding today's visit and imaging study findings. Given stone's size and location, definitive intervention may be required. With its noted visibility, he is a candidate for shockwave lithotripsy. I strongly encouraged him to continue tamsulosin, remain well hydrated and try to remain active as possible. He has pain medication to take if needed for any new or worsening pain/discomfort. If medical expulsive therapy recommended by his urologist, I will contact the patient have him follow up again in a couple weeks for repeat exam with KUB. Otherwise I will coordinate scheduling him for possible definitive intervention of the distal ureteral calculus. We discussed return to clinic follow-up instructions in the event he has new/worsening pain and discomfort, new/worsening lower  urinary tract symptoms that are not well controlled or other constitutional signs/symptoms of systemic infection. A urine culture sent today as well.        Next Appointment:      Next Appointment: 12/12/2019 01:00 PM    Appointment Type: Office Visit Established Patient    Location: [facility] [phone]    Provider: Jiles Crocker, NP    Reason for Visit: ks f/u , ER notes printed

## 2019-12-20 ENCOUNTER — Encounter (HOSPITAL_BASED_OUTPATIENT_CLINIC_OR_DEPARTMENT_OTHER)
Admission: RE | Disposition: A | Discharge status: Home / Self Care | Payer: Self-pay | Source: Home / Self Care | Attending: Urology

## 2019-12-20 ENCOUNTER — Ambulatory Visit (HOSPITAL_COMMUNITY): Payer: PPO

## 2019-12-20 ENCOUNTER — Ambulatory Visit (HOSPITAL_BASED_OUTPATIENT_CLINIC_OR_DEPARTMENT_OTHER)
Admission: RE | Admit: 2019-12-20 | Discharge: 2019-12-20 | Disposition: A | Discharge status: Home / Self Care | Payer: PPO | Attending: Urology | Admitting: Urology

## 2019-12-20 ENCOUNTER — Encounter (HOSPITAL_BASED_OUTPATIENT_CLINIC_OR_DEPARTMENT_OTHER): Payer: Self-pay | Admitting: Anesthesiology

## 2019-12-20 ENCOUNTER — Encounter (HOSPITAL_BASED_OUTPATIENT_CLINIC_OR_DEPARTMENT_OTHER): Payer: Self-pay | Admitting: Urology

## 2019-12-20 ENCOUNTER — Other Ambulatory Visit: Payer: Self-pay

## 2019-12-20 DIAGNOSIS — N201 Calculus of ureter: Secondary | ICD-10-CM | POA: Insufficient documentation

## 2019-12-20 DIAGNOSIS — Z87442 Personal history of urinary calculi: Secondary | ICD-10-CM | POA: Diagnosis not present

## 2019-12-20 DIAGNOSIS — Z7982 Long term (current) use of aspirin: Secondary | ICD-10-CM | POA: Insufficient documentation

## 2019-12-20 DIAGNOSIS — M47816 Spondylosis without myelopathy or radiculopathy, lumbar region: Secondary | ICD-10-CM | POA: Diagnosis not present

## 2019-12-20 HISTORY — PX: EXTRACORPOREAL SHOCK WAVE LITHOTRIPSY: SHX1557

## 2019-12-20 SURGERY — LITHOTRIPSY, ESWL
Anesthesia: LOCAL | Laterality: Left

## 2019-12-20 MED ORDER — DIAZEPAM 5 MG PO TABS
10.0000 mg | ORAL_TABLET | ORAL | Status: AC
  Administered 2019-12-20: 14:00:00 10 mg via ORAL

## 2019-12-20 MED ORDER — ACETAMINOPHEN 325 MG RE SUPP: 650.0000 mg | RECTAL | Status: DC | PRN

## 2019-12-20 MED ORDER — CIPROFLOXACIN HCL 500 MG PO TABS
ORAL_TABLET | ORAL | Status: AC
  Filled 2019-12-20: qty 1

## 2019-12-20 MED ORDER — CIPROFLOXACIN HCL 500 MG PO TABS
500.0000 mg | ORAL_TABLET | ORAL | Status: AC
  Administered 2019-12-20: 14:00:00 500 mg via ORAL

## 2019-12-20 MED ORDER — ACETAMINOPHEN 325 MG PO TABS: 650.0000 mg | ORAL_TABLET | ORAL | Status: DC | PRN

## 2019-12-20 MED ORDER — SODIUM CHLORIDE 0.9% FLUSH: 3.0000 mL | Freq: Two times a day (BID) | INTRAVENOUS | Status: DC

## 2019-12-20 MED ORDER — SODIUM CHLORIDE 0.9 % IV SOLN: INTRAVENOUS | Status: DC

## 2019-12-20 MED ORDER — SODIUM CHLORIDE 0.9 % IV SOLN: 250.0000 mL | INTRAVENOUS | Status: DC | PRN

## 2019-12-20 MED ORDER — SODIUM CHLORIDE 0.9% FLUSH: 3.0000 mL | INTRAVENOUS | Status: DC | PRN

## 2019-12-20 MED ORDER — DIAZEPAM 5 MG PO TABS
ORAL_TABLET | ORAL | Status: AC
  Filled 2019-12-20: qty 2

## 2019-12-20 MED ORDER — MORPHINE SULFATE (PF) 4 MG/ML IV SOLN: 1.0000 mg | INTRAVENOUS | Status: DC | PRN

## 2019-12-20 MED ORDER — OXYCODONE HCL 5 MG PO TABS: 5.0000 mg | ORAL_TABLET | ORAL | Status: DC | PRN

## 2019-12-20 MED ORDER — DIPHENHYDRAMINE HCL 25 MG PO CAPS
25.0000 mg | ORAL_CAPSULE | ORAL | Status: AC
  Administered 2019-12-20: 14:00:00 25 mg via ORAL

## 2019-12-20 MED ORDER — DIPHENHYDRAMINE HCL 25 MG PO CAPS
ORAL_CAPSULE | ORAL | Status: AC
  Filled 2019-12-20: qty 1

## 2019-12-20 NOTE — Interval H&P Note (Signed)
History and Physical Interval Note:  The distal stone hasn't moved.  The renal stone may have moved to the renal pelvis.   12/20/2019 4:12 PM  Dennis Reynolds  has presented today for surgery, with the diagnosis of LEFT URETERAL STONE.  The various methods of treatment have been discussed with the patient and family. After consideration of risks, benefits and other options for treatment, the patient has consented to  Procedure(s): LEFT EXTRACORPOREAL SHOCK WAVE LITHOTRIPSY (ESWL) (Left) as a surgical intervention.  The patient's history has been reviewed, patient examined, no change in status, stable for surgery.  I have reviewed the patient's chart and labs.  Questions were answered to the patient's satisfaction.     Irine Seal

## 2019-12-20 NOTE — Discharge Instructions (Signed)
Lithotripsy, Care After This sheet gives you information about how to care for yourself after your procedure. Your health care provider may also give you more specific instructions. If you have problems or questions, contact your health care provider. What can I expect after the procedure? After the procedure, it is common to have:  Some blood in your urine. This should only last for a few days.  Soreness in your back, sides, or upper abdomen for a few days.  Blotches or bruises on your back where the pressure wave entered the skin.  Pain, discomfort, or nausea when pieces (fragments) of the kidney stone move through the tube that carries urine from the kidney to the bladder (ureter). Stone fragments may pass soon after the procedure, but they may continue to pass for up to 4-8 weeks. ? If you have severe pain or nausea, contact your health care provider. This may be caused by a large stone that was not broken up, and this may mean that you need more treatment.  Some pain or discomfort during urination.  Some pain or discomfort in the lower abdomen or (in men) at the base of the penis. Follow these instructions at home: Medicines  Take over-the-counter and prescription medicines only as told by your health care provider.  If you were prescribed an antibiotic medicine, take it as told by your health care provider. Do not stop taking the antibiotic even if you start to feel better.  Do not drive for 24 hours if you were given a medicine to help you relax (sedative).  Do not drive or use heavy machinery while taking prescription pain medicine. Eating and drinking      Drink enough water and fluids to keep your urine clear or pale yellow. This helps any remaining pieces of the stone to pass. It can also help prevent new stones from forming.  Eat plenty of fresh fruits and vegetables.  Follow instructions from your health care provider about eating and drinking restrictions. You may be  instructed: ? To reduce how much salt (sodium) you eat or drink. Check ingredients and nutrition facts on packaged foods and beverages. ? To reduce how much meat you eat.  Eat the recommended amount of calcium for your age and gender. Ask your health care provider how much calcium you should have. General instructions  Get plenty of rest.  Most people can resume normal activities 1-2 days after the procedure. Ask your health care provider what activities are safe for you.  Your health care provider may direct you to lie in a certain position (postural drainage) and tap firmly (percuss) over your kidney area to help stone fragments pass. Follow instructions as told by your health care provider.  If directed, strain all urine through the strainer that was provided by your health care provider. ? Keep all fragments for your health care provider to see. Any stones that are found may be sent to a medical lab for examination. The stone may be as small as a grain of salt.  Keep all follow-up visits as told by your health care provider. This is important. Contact a health care provider if:  You have pain that is severe or does not get better with medicine.  You have nausea that is severe or does not go away.  You have blood in your urine longer than your health care provider told you to expect.  You have more blood in your urine.  You have pain during urination that does   not go away.  You urinate more frequently than usual and this does not go away.  You develop a rash or any other possible signs of an allergic reaction. Get help right away if:  You have severe pain in your back, sides, or upper abdomen.  You have severe pain while urinating.  Your urine is very dark red.  You have blood in your stool (feces).  You cannot pass any urine at all.  You feel a strong urge to urinate after emptying your bladder.  You have a fever or chills.  You develop shortness of breath,  difficulty breathing, or chest pain.  You have severe nausea that leads to persistent vomiting.  You faint. Summary  After this procedure, it is common to have some pain, discomfort, or nausea when pieces (fragments) of the kidney stone move through the tube that carries urine from the kidney to the bladder (ureter). If this pain or nausea is severe, however, you should contact your health care provider.  Most people can resume normal activities 1-2 days after the procedure. Ask your health care provider what activities are safe for you.  Drink enough water and fluids to keep your urine clear or pale yellow. This helps any remaining pieces of the stone to pass, and it can help prevent new stones from forming.  If directed, strain your urine and keep all fragments for your health care provider to see. Fragments or stones may be as small as a grain of salt.  Get help right away if you have severe pain in your back, sides, or upper abdomen or have severe pain while urinating. This information is not intended to replace advice given to you by your health care provider. Make sure you discuss any questions you have with your health care provider. Document Revised: 04/03/2018 Document Reviewed: 11/12/2015 Elsevier Patient Education  2020 [employer]  

## 2019-12-21 ENCOUNTER — Encounter (HOSPITAL_BASED_OUTPATIENT_CLINIC_OR_DEPARTMENT_OTHER): Payer: Self-pay | Admitting: Urology

## 2019-12-31 ENCOUNTER — Ambulatory Visit: Payer: PPO | Admitting: Pulmonary Disease

## 2019-12-31 ENCOUNTER — Other Ambulatory Visit: Payer: Self-pay

## 2019-12-31 ENCOUNTER — Encounter: Payer: Self-pay | Admitting: Pulmonary Disease

## 2019-12-31 VITALS — BP 124/68 | HR 65 | Ht 68.0 in | Wt 158.8 lb

## 2019-12-31 DIAGNOSIS — Z7722 Contact with and (suspected) exposure to environmental tobacco smoke (acute) (chronic): Secondary | ICD-10-CM | POA: Diagnosis not present

## 2019-12-31 DIAGNOSIS — R911 Solitary pulmonary nodule: Secondary | ICD-10-CM

## 2019-12-31 NOTE — Patient Instructions (Addendum)
Thank you for visiting Dr. Tonia Brooms at [facility]. Today we recommend the following:  Orders Placed This Encounter  Procedures  . CT Chest Wo Contrast   Return in about 3 months (around 03/30/2020) for with APP or Dr. Tonia Brooms.    Please do your part to reduce the spread of COVID-19.

## 2019-12-31 NOTE — Progress Notes (Signed)
Synopsis: Referred in December 2021 for lung nodule by Shon Baton, MD  Subjective:   PATIENT ID: Dennis Reynolds GENDER: male DOB: 01-31-1931, MRN: [id number]  Chief Complaint  Patient presents with  . Consult    Pt is being referred by Dr. Virgina Jock due to lung nodule seen on imaging.  Pt denies any complaints of cough, SOB, or CP and states that he is unsure why he is here for the visit.    This is an 86 year old gentleman with a past medical history of BPH, COPD, GERD, hyperlipidemia, prostate cancer, radiation proctitis.  Recent left extracorporeal shockwave lithotripsy for ureteral stone.  Patient had a CT scan of the abdomen and pelvis , 12/10/2019 which revealed a new 9 mm noncalcified pulmonary nodule within the left lower lobe of the lung.  Patient was referred here for evaluation of the lung nodule and next steps.  12/31/2019 OV: Patient here today for evaluation of incidentally found lung nodule.  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  He was around a lot of secondhand smoke cigarettes exposure but he has never been a smoker himself.  Additionally had exposures with being a Agricultural consultant.  In the office today with his wife of 60+ years.  Patient denies weight loss fevers chills night sweats.   Past Medical History:  Diagnosis Date  . Arthritis   . BPH (benign prostatic hyperplasia)   . COPD (chronic obstructive pulmonary disease) ([place])   . Diverticulosis   . Duodenal ulcer due to Helicobacter pylori   . ED (erectile dysfunction)   . GERD (gastroesophageal reflux disease)   . Hyperlipidemia   . Internal hemorrhoid   . Neuropathy    LLE  . Osteopenia   . Otitis media    chronic  . Prostate cancer ([place]) 2003  . Radiation proctitis   . Retinal tear    x 2  . Sinus bradycardia   . Stricture and stenosis of esophagus   . Vitamin D deficiency      Family History  Problem Relation Age of Onset  . Colon cancer Sister   . Diabetes Sister   . Heart  disease Mother   . Prostate cancer Father   . Parkinsonism Father      Past Surgical History:  Procedure Laterality Date  . COLONOSCOPY     multiple  . ESOPHAGOGASTRODUODENOSCOPY    . EXTRACORPOREAL SHOCK WAVE LITHOTRIPSY Left 12/20/2019   Procedure: LEFT EXTRACORPOREAL SHOCK WAVE LITHOTRIPSY (ESWL);  Surgeon: Irine Seal, MD;  Location: [facility];  Service: Urology;  Laterality: Left;  . [place]   right  . INNER EAR SURGERY     right  . OTHER SURGICAL HISTORY     radiation for prostate  . PROSTATECTOMY    . RETINAL DETACHMENT SURGERY     x 2, left  . TONSILLECTOMY AND ADENOIDECTOMY    . VASECTOMY      Social History   Socioeconomic History  . Marital status: Married    Spouse name: Dennis Reynolds  . Number of children: 2  . Years of education: Not on file  . Highest education level: Not on file  Occupational History  . Occupation: retired Airline pilot  Tobacco Use  . Smoking status: Never Smoker  . Smokeless tobacco: Never Used  Vaping Use  . Vaping Use: Never used  Substance and Sexual Activity  . Alcohol use: No  . Drug use: No  . Sexual activity: Not  on file  Other Topics Concern  . Not on file  Social History Narrative   Right handed   Two story home   occ caffeine   Social Determinants of Health   Financial Resource Strain: Not on file  Food Insecurity: Not on file  Transportation Needs: Not on file  Physical Activity: Not on file  Stress: Not on file  Social Connections: Not on file  Intimate Partner Violence: Not on file     No Known Allergies   Outpatient Medications Prior to Visit  Medication Sig Dispense Refill  . alendronate (FOSAMAX) 70 MG tablet Take 70 mg by mouth once a week. Take with a full glass of water on an empty stomach.    Marland Kitchen aspirin EC 325 MG tablet Take 325 mg by mouth every 4 (four) hours as needed for mild pain.    . tamsulosin (FLOMAX) 0.4 MG CAPS capsule Take 1 capsule (0.4 mg total) by mouth  daily. 7 capsule 0  . HYDROcodone-acetaminophen (NORCO/VICODIN) 5-325 MG tablet Take 1 tablet by mouth every 6 (six) hours as needed for severe pain. (Patient not taking: Reported on 12/31/2019) 10 tablet 0  . ondansetron (ZOFRAN ODT) 4 MG disintegrating tablet Take 1 tablet (4 mg total) by mouth every 8 (eight) hours as needed for nausea or vomiting. (Patient not taking: Reported on 12/31/2019) 20 tablet 0   No facility-administered medications prior to visit.    Review of Systems  Constitutional: Negative for chills, fever, malaise/fatigue and weight loss.  HENT: Negative for hearing loss, sore throat and tinnitus.   Eyes: Negative for blurred vision and double vision.  Respiratory: Negative for cough, hemoptysis, sputum production, shortness of breath, wheezing and stridor.   Cardiovascular: Negative for chest pain, palpitations, orthopnea, leg swelling and PND.  Gastrointestinal: Negative for abdominal pain, constipation, diarrhea, heartburn, nausea and vomiting.  Genitourinary: Negative for dysuria, hematuria and urgency.  Musculoskeletal: Negative for joint pain and myalgias.  Skin: Negative for itching and rash.  Neurological: Negative for dizziness, tingling, weakness and headaches.  Endo/Heme/Allergies: Negative for environmental allergies. Does not bruise/bleed easily.  Psychiatric/Behavioral: Negative for depression. The patient is not nervous/anxious and does not have insomnia.   All other systems reviewed and are negative.    Objective:  Physical Exam Vitals reviewed.  Constitutional:      General: He is not in acute distress.    Appearance: He is well-developed and well-nourished.  HENT:     Head: Normocephalic and atraumatic.     Mouth/Throat:     Mouth: Oropharynx is clear and moist.  Eyes:     General: No scleral icterus.    Conjunctiva/sclera: Conjunctivae normal.     Pupils: Pupils are equal, round, and reactive to light.  Neck:     Vascular: No JVD.      Trachea: No tracheal deviation.  Cardiovascular:     Rate and Rhythm: Normal rate and regular rhythm.     Pulses: Intact distal pulses.     Heart sounds: Normal heart sounds. No murmur heard.   Pulmonary:     Effort: Pulmonary effort is normal. No tachypnea, accessory muscle usage or respiratory distress.     Breath sounds: Normal breath sounds. No stridor. No wheezing, rhonchi or rales.  Abdominal:     General: Bowel sounds are normal. There is no distension.     Palpations: Abdomen is soft.     Tenderness: There is no abdominal tenderness.  Musculoskeletal:  General: No tenderness or edema.     Cervical back: Neck supple.  Lymphadenopathy:     Cervical: No cervical adenopathy.  Skin:    General: Skin is warm and dry.     Capillary Refill: Capillary refill takes less than 2 seconds.     Findings: No rash.  Neurological:     Mental Status: He is alert and oriented to person, place, and time.  Psychiatric:        Mood and Affect: Mood and affect normal.        Behavior: Behavior normal.      Vitals:   12/31/19 1115  BP: 124/68  Pulse: 65  SpO2: 99%  Weight: 158 lb 12.8 oz (72 kg)  Height: 5\' 8"  (1.727 m)   99% on RA BMI Readings from Last 3 Encounters:  12/31/19 24.15 kg/m  12/20/19 20.98 kg/m  10/28/19 29.30 kg/m   Wt Readings from Last 3 Encounters:  12/31/19 158 lb 12.8 oz (72 kg)  12/20/19 138 lb (62.6 kg)  10/28/19 198 lb 6.6 oz (90 kg)     CBC    Component Value Date/Time   WBC 9.8 12/10/2019 0819   RBC 4.44 12/10/2019 0819   HGB 13.3 12/10/2019 0819   HCT 41.4 12/10/2019 0819   PLT 249 12/10/2019 0819   MCV 93.2 12/10/2019 0819   MCH 30.0 12/10/2019 0819   MCHC 32.1 12/10/2019 0819   RDW 13.7 12/10/2019 0819   LYMPHSABS 1.0 12/10/2019 0819   MONOABS 0.6 12/10/2019 0819   EOSABS 0.1 12/10/2019 0819   BASOSABS 0.0 12/10/2019 0819    Chest Imaging: 12/10/2019 CT abdomen and pelvis: There is a 9 mm noncalcified pulmonary nodule that  is on the lateral aspect of the left lung subpleural in nature.  Due to its size would recommend close follow-up.  The patient's images have been independently reviewed by me.     Pulmonary Functions Testing Results: No flowsheet data found.  FeNO:   Pathology:   Echocardiogram:   Heart Catheterization:     Assessment & Plan:     ICD-10-CM   1. Nodule of lower lobe of left lung  R91.1 CT Chest Wo Contrast  2. Second hand smoke exposure  Z77.22     Discussion:  86 year old gentleman, incidentally found left lower lobe pulmonary nodule.  He does have secondhand smoke exposure but is a lifelong non-smoker.  He is retired 88 40+ years [place].  Plan: We discussed the recommended follow-up images regarding lung nodules as well as their percent risk of being benign in non-smokers. Due to the patient's lung nodule characteristics shape size and location I would favor it being benign however would recommend close short-term follow-up due to the size of the lesion. We will recommend starting with a noncontrasted CT of the chest as we were only able to take a glimpse of the lower lobe on his CT abdomen and pelvis. If this shows stability of the nodule we will recommend a repeat noncontrasted CT of the chest in 3 months from now.  He can be seen by myself or APP at this time to review CT images. If the nodule changes grows in size we can discuss potential for next best steps which may include biopsy or PET scan imaging.  We discussed this today in the office in detail.  Patient was accompanied by the patient's wife and all questions for both were answered.  Additional time spent with patient reviewing images from recent CT scan.  Current Outpatient Medications:  .  alendronate (FOSAMAX) 70 MG tablet, Take 70 mg by mouth once a week. Take with a full glass of water on an empty stomach., Disp: , Rfl:  .  aspirin EC 325 MG tablet, Take 325 mg by mouth every 4 (four) hours as  needed for mild pain., Disp: , Rfl:  .  tamsulosin (FLOMAX) 0.4 MG CAPS capsule, Take 1 capsule (0.4 mg total) by mouth daily., Disp: 7 capsule, Rfl: 0 .  HYDROcodone-acetaminophen (NORCO/VICODIN) 5-325 MG tablet, Take 1 tablet by mouth every 6 (six) hours as needed for severe pain. (Patient not taking: Reported on 12/31/2019), Disp: 10 tablet, Rfl: 0 .  ondansetron (ZOFRAN ODT) 4 MG disintegrating tablet, Take 1 tablet (4 mg total) by mouth every 8 (eight) hours as needed for nausea or vomiting. (Patient not taking: Reported on 12/31/2019), Disp: 20 tablet, Rfl: 0  I spent 62 minutes dedicated to the care of this patient on the date of this encounter to include pre-visit review of records, face-to-face time with the patient discussing conditions above, post visit ordering of testing, clinical documentation with the electronic health record, making appropriate referrals as documented, and communicating necessary findings to members of the patients care team.   Garner Nash, DO [facility] 12/31/2019 11:32 AM

## 2020-01-03 DIAGNOSIS — N201 Calculus of ureter: Secondary | ICD-10-CM | POA: Diagnosis not present

## 2020-01-08 ENCOUNTER — Ambulatory Visit (INDEPENDENT_AMBULATORY_CARE_PROVIDER_SITE_OTHER)
Admission: RE | Admit: 2020-01-08 | Discharge: 2020-01-08 | Disposition: A | Discharge status: Home / Self Care | Payer: PPO | Source: Ambulatory Visit | Attending: Pulmonary Disease | Admitting: Pulmonary Disease

## 2020-01-08 ENCOUNTER — Other Ambulatory Visit: Payer: Self-pay

## 2020-01-08 DIAGNOSIS — R911 Solitary pulmonary nodule: Secondary | ICD-10-CM

## 2020-01-08 DIAGNOSIS — J8410 Pulmonary fibrosis, unspecified: Secondary | ICD-10-CM | POA: Diagnosis not present

## 2020-01-08 DIAGNOSIS — I700 Atherosclerosis of aorta: Secondary | ICD-10-CM | POA: Diagnosis not present

## 2020-01-08 DIAGNOSIS — I2510 Atherosclerotic heart disease of native coronary artery without angina pectoris: Secondary | ICD-10-CM | POA: Diagnosis not present

## 2020-01-09 ENCOUNTER — Telehealth: Payer: Self-pay | Admitting: Pulmonary Disease

## 2020-01-09 ENCOUNTER — Other Ambulatory Visit: Payer: Self-pay | Admitting: Urology

## 2020-01-09 DIAGNOSIS — R911 Solitary pulmonary nodule: Secondary | ICD-10-CM

## 2020-01-09 NOTE — Telephone Encounter (Signed)
PCCM:  Please let him know the CT Chest on revealed the known 9 mm nodule.  We will continue to follow this nodule closely.   Please order a repeat non-contrasted CT Chest to be completed in 3 months.  Can follow up with me or APP after CT to review results of repeat ct   Thanks  Josephine Igo, DO [facility] 01/09/2020 6:55 AM

## 2020-01-09 NOTE — Telephone Encounter (Signed)
I have attempted to call the pt but there was no answer and no VM available.   I will try back again tomorrow.

## 2020-01-15 ENCOUNTER — Ambulatory Visit (INDEPENDENT_AMBULATORY_CARE_PROVIDER_SITE_OTHER): Payer: PPO | Admitting: Diagnostic Neuroimaging

## 2020-01-15 ENCOUNTER — Encounter: Payer: Self-pay | Admitting: *Deleted

## 2020-01-15 VITALS — BP 144/87 | HR 67 | Ht 68.0 in | Wt 159.0 lb

## 2020-01-15 DIAGNOSIS — R413 Other amnesia: Secondary | ICD-10-CM | POA: Diagnosis not present

## 2020-01-15 NOTE — Patient Instructions (Signed)
°  MILD MEMORY LOSS (short term memory loss; MMSE 24/30; no major changes in ADLs per wife; could be mild cognitive impairment vs prodromal mild dementia) - safety / supervision issues reviewed - daily physical activity / exercise (at least 15-30 minutes) - eat more plants / vegetables - increase social activities, brain stimulation, games, puzzles, hobbies, crafts, arts, music - aim for at least 7-8 hours sleep per night (or more) - avoid smoking and alcohol - caregiver resources provided - caution with medications, finances, driving

## 2020-01-15 NOTE — Progress Notes (Signed)
[facility]  PATIENT: Dennis Reynolds DOB: 23-Jan-1931  REFERRING CLINICIAN: Shon Baton, MD HISTORY FROM: patient and wife REASON FOR VISIT: new consult    HISTORICAL  CHIEF COMPLAINT:  Chief Complaint  Patient presents with  . Transient global amnesia    Rm 6 New Pt, wife- Thayer Headings    HISTORY OF PRESENT ILLNESS:   87 year old male here for evaluation of short-term memory loss and confusion.  In December 2020 patient was driving his car with wife, when he got disoriented and could not figure out how to get to a familiar location.  Patient went to the emergency room for evaluation and was evaluated for possible TIA versus TGA (transient global amnesia).  Patient followed up with outpatient neurology Dr. Tomi Likens, who checked MRI and EEG which were unremarkable.  He also had follow-up visit with them.  More recently patient's daughter has also noticed other issues related to memory.  Patient had forgot that he had given his wife flowers for her birthday.  When they were at the beach over the summer he forgot that their dog did not come to the vacation with them.  Patient seems to do better in familiar locations and routines.  Sometimes he has significant short-term memory loss and disorientation at other times.  Wife does not notice these problems as much.  She does admit to some memory problems herself.  Patient lives with wife at home.  He is able to maintain most of his ADLs at home and able to function independently outside of home to familiar locations and tasks.    REVIEW OF SYSTEMS: Full 14 system review of systems performed and negative with exception of: as per HPI.   ALLERGIES: No Known Allergies  HOME MEDICATIONS: Outpatient Medications Prior to Visit  Medication Sig Dispense Refill  . alendronate (FOSAMAX) 70 MG tablet Take 70 mg by mouth once a week. Take with a full glass of water on an empty stomach.    Marland Kitchen aspirin EC 325 MG tablet Take 325 mg by  mouth every 4 (four) hours as needed for mild pain.    . tamsulosin (FLOMAX) 0.4 MG CAPS capsule Take 1 capsule (0.4 mg total) by mouth daily. 7 capsule 0  . HYDROcodone-acetaminophen (NORCO/VICODIN) 5-325 MG tablet Take 1 tablet by mouth every 6 (six) hours as needed for severe pain. (Patient not taking: No sig reported) 10 tablet 0  . ondansetron (ZOFRAN ODT) 4 MG disintegrating tablet Take 1 tablet (4 mg total) by mouth every 8 (eight) hours as needed for nausea or vomiting. (Patient not taking: No sig reported) 20 tablet 0   No facility-administered medications prior to visit.    PAST MEDICAL HISTORY: Past Medical History:  Diagnosis Date  . Arthritis   . BPH (benign prostatic hyperplasia)   . COPD (chronic obstructive pulmonary disease) ([place])   . Diverticulosis   . Duodenal ulcer due to Helicobacter pylori   . ED (erectile dysfunction)   . GERD (gastroesophageal reflux disease)   . Hyperlipidemia   . Internal hemorrhoid   . Neuropathy    LLE  . Osteopenia   . Otitis media    chronic  . Prostate cancer ([place]) 2003  . Radiation proctitis   . Retinal tear    x 2  . Sinus bradycardia   . Stricture and stenosis of esophagus   . Transient global amnesia   . Vitamin D deficiency     PAST SURGICAL HISTORY: Past Surgical History:  Procedure  Laterality Date  . COLONOSCOPY     multiple  . ESOPHAGOGASTRODUODENOSCOPY    . EXTRACORPOREAL SHOCK WAVE LITHOTRIPSY Left 12/20/2019   Procedure: LEFT EXTRACORPOREAL SHOCK WAVE LITHOTRIPSY (ESWL);  Surgeon: Irine Seal, MD;  Location: [facility];  Service: Urology;  Laterality: Left;  . [place]   right  . INNER EAR SURGERY     right  . OTHER SURGICAL HISTORY     radiation for prostate  . PROSTATECTOMY    . RETINAL DETACHMENT SURGERY     x 2, left  . TONSILLECTOMY AND ADENOIDECTOMY    . VASECTOMY      FAMILY HISTORY: Family History  Problem Relation Age of Onset  . Colon cancer Sister   .  Diabetes Sister   . Heart disease Mother   . Prostate cancer Father   . Parkinsonism Father     SOCIAL HISTORY: Social History   Socioeconomic History  . Marital status: Married    Spouse name: [name]  . Number of children: 2  . Years of education: Not on file  . Highest education level: Associate degree: occupational, Hotel manager, or vocational program  Occupational History  . Occupation: retired Airline pilot  Tobacco Use  . Smoking status: Never Smoker  . Smokeless tobacco: Never Used  Vaping Use  . Vaping Use: Never used  Substance and Sexual Activity  . Alcohol use: No  . Drug use: No  . Sexual activity: Not on file  Other Topics Concern  . Not on file  Social History Narrative   Lives with wife, Right handed   Two story home   occ caffeine   Social Determinants of Health   Financial Resource Strain: Not on file  Food Insecurity: Not on file  Transportation Needs: Not on file  Physical Activity: Not on file  Stress: Not on file  Social Connections: Not on file  Intimate Partner Violence: Not on file     PHYSICAL EXAM  GENERAL EXAM/CONSTITUTIONAL: Vitals:  Vitals:   01/15/20 1406  BP: (!) 144/87  Pulse: 67  Weight: 159 lb (72.1 kg)  Height: 5\' 8"  (1.727 m)     Body mass index is 24.18 kg/m. Wt Readings from Last 3 Encounters:  01/15/20 159 lb (72.1 kg)  12/31/19 158 lb 12.8 oz (72 kg)  12/20/19 138 lb (62.6 kg)     Patient is in no distress; well developed, nourished and groomed; neck is supple  CARDIOVASCULAR:  Examination of carotid arteries is normal; no carotid bruits  Regular rate and rhythm, no murmurs  Examination of peripheral vascular system by observation and palpation is normal  EYES:  Ophthalmoscopic exam of optic discs and posterior segments is normal; no papilledema or hemorrhages  No exam data present  MUSCULOSKELETAL:  Gait, strength, tone, movements noted in Neurologic exam below  NEUROLOGIC: MENTAL STATUS:  No  flowsheet data found.  awake, alert, oriented to person, place and time  recent and remote memory intact  normal attention and concentration  language fluent, comprehension intact, naming intact  fund of knowledge appropriate  CRANIAL NERVE:   2nd - no papilledema on fundoscopic exam  2nd, 3rd, 4th, 6th - pupils equal and reactive to light, visual fields full to confrontation, extraocular muscles intact, no nystagmus  5th - facial sensation symmetric  7th - facial strength symmetric  8th - hearing intact  9th - palate elevates symmetrically, uvula midline  11th - shoulder shrug symmetric  12th - tongue protrusion midline  MOTOR:   normal bulk and tone, full strength in the BUE, BLE  SENSORY:   normal and symmetric to light touch, temperature, vibration  COORDINATION:   finger-nose-finger, fine finger movements normal  REFLEXES:   deep tendon reflexes present and symmetric  GAIT/STATION:   narrow based gait     DIAGNOSTIC DATA (LABS, IMAGING, TESTING) - I reviewed patient records, labs, notes, testing and imaging myself where available.  Lab Results  Component Value Date   WBC 9.8 12/10/2019   HGB 13.3 12/10/2019   HCT 41.4 12/10/2019   MCV 93.2 12/10/2019   PLT 249 12/10/2019      Component Value Date/Time   NA 138 12/10/2019 0819   K 4.0 12/10/2019 0819   CL 105 12/10/2019 0819   CO2 21 (L) 12/10/2019 0819   GLUCOSE 149 (H) 12/10/2019 0819   BUN 15 12/10/2019 0819   CREATININE 1.19 12/10/2019 0819   CALCIUM 9.1 12/10/2019 0819   PROT 6.6 12/10/2019 0819   ALBUMIN 3.7 12/10/2019 0819   AST 24 12/10/2019 0819   ALT 17 12/10/2019 0819   ALKPHOS 44 12/10/2019 0819   BILITOT 1.0 12/10/2019 0819   GFRNONAA 59 (L) 12/10/2019 0819   GFRAA >60 12/18/2018 1500   No results found for: CHOL, HDL, LDLCALC, LDLDIRECT, TRIG, CHOLHDL No results found for: HGBA1C No results found for: VITAMINB12 No results found for: TSH   02/26/19 MRI brain -  No evidence of acute intracranial abnormality. - Mild-to-moderate chronic small vessel ischemic disease. - Moderate generalized parenchymal atrophy.  01/24/19 EEG - normal    ASSESSMENT AND PLAN  87 y.o. year old male here with:  Dx:  1. Memory loss     PLAN:  MILD MEMORY LOSS (short term memory loss; MMSE 24/30; no major changes in ADLs per wife; could be mild cognitive impairment vs prodromal mild dementia) - safety / supervision issues reviewed - daily physical activity / exercise (at least 15-30 minutes) - eat more plants / vegetables - increase social activities, brain stimulation, games, puzzles, hobbies, crafts, arts, music - aim for at least 7-8 hours sleep per night (or more) - avoid smoking and alcohol - caregiver resources provided - caution with medications, finances, driving  Return for return to PCP, pending if symptoms worsen or fail to improve.    Penni Bombard, MD [id number], [id number] PM Certified in Neurology, Neurophysiology and Neuroimaging  [facility] [address], [place], [place] [address] [phone]

## 2020-01-18 ENCOUNTER — Other Ambulatory Visit (HOSPITAL_COMMUNITY)
Admission: RE | Admit: 2020-01-18 | Discharge: 2020-01-18 | Disposition: A | Discharge status: Home / Self Care | Payer: PPO | Source: Ambulatory Visit | Attending: Urology | Admitting: Urology

## 2020-01-18 DIAGNOSIS — Z20822 Contact with and (suspected) exposure to covid-19: Secondary | ICD-10-CM | POA: Diagnosis not present

## 2020-01-18 DIAGNOSIS — Z01812 Encounter for preprocedural laboratory examination: Secondary | ICD-10-CM | POA: Diagnosis not present

## 2020-01-19 LAB — SARS CORONAVIRUS 2 (TAT 6-24 HRS): SARS Coronavirus 2: NEGATIVE

## 2020-01-21 ENCOUNTER — Other Ambulatory Visit: Payer: Self-pay

## 2020-01-21 ENCOUNTER — Encounter (HOSPITAL_BASED_OUTPATIENT_CLINIC_OR_DEPARTMENT_OTHER): Payer: Self-pay | Admitting: Urology

## 2020-01-21 NOTE — Progress Notes (Signed)
Spoke w/ via phone for pre-op interview--- Pt and Pt's wife, Dennis Reynolds needs dos----   no            Reynolds results------ current ekg in epic;/ chart COVID test ------ done 01-18-2020 negative result in epic Arrive at ------- 0845 NPO after MN  Medications to take morning of surgery ----- NONE Diabetic medication ----- n/a Patient Special Instructions ----- n/a Pre-Op special Istructions ----- may need pt wife in pre-op for meds Patient verbalized understanding of instructions that were given at this phone interview. Patient denies shortness of breath, chest pain, fever, cough at this phone interview.

## 2020-01-21 NOTE — Anesthesia Preprocedure Evaluation (Addendum)
Anesthesia Evaluation  Patient identified by MRN, date of birth, ID band Patient awake    Reviewed: Allergy & Precautions, H&P , NPO status , Patient's Chart, lab work & pertinent test results  Airway Mallampati: I  TM Distance: >3 FB Neck ROM: Full    Dental no notable dental hx. (+) Teeth Intact, Dental Advisory Given,    Pulmonary neg pulmonary ROS, COPD,    Pulmonary exam normal breath sounds clear to auscultation       Cardiovascular Exercise Tolerance: Good negative cardio ROS Normal cardiovascular exam Rhythm:Regular Rate:Normal     Neuro/Psych negative neurological ROS  negative psych ROS   GI/Hepatic Neg liver ROS, PUD, GERD  Medicated and Controlled,  Endo/Other  negative endocrine ROS  Renal/GU negative Renal ROS  negative genitourinary   Musculoskeletal  (+) Arthritis , Osteoarthritis,    Abdominal   Peds negative pediatric ROS (+)  Hematology negative hematology ROS (+)   Anesthesia Other Findings   Reproductive/Obstetrics negative OB ROS                            Anesthesia Physical Anesthesia Plan  ASA: III  Anesthesia Plan: General   Post-op Pain Management:    Induction: Intravenous  PONV Risk Score and Plan: 2 and Ondansetron and Treatment may vary due to age or medical condition  Airway Management Planned: LMA and Oral ETT  Additional Equipment: None  Intra-op Plan:   Post-operative Plan: Extubation in OR  Informed Consent: I have reviewed the patients History and Physical, chart, labs and discussed the procedure including the risks, benefits and alternatives for the proposed anesthesia with the patient or authorized representative who has indicated his/her understanding and acceptance.       Plan Discussed with: CRNA, Surgeon and Anesthesiologist  Anesthesia Plan Comments: ( )       Anesthesia Quick Evaluation

## 2020-01-21 NOTE — H&P (Signed)
I have had kidney stone surgery.  HPI: Dennis Reynolds is a 87 year-old male established patient who is here for renal calculi after a surgical intervention.  01/03/2020: Patient underwent shockwave lithotripsy for previously identified obstructing left distal ureteral calculi. Procedure done earlier this month on 12/16. He follows up today for repeat examination in [facility]. His urologist got a message to me about possible treatment failure due to poor fragmentation or break-up of the stone during the procedure. Today patient doing well. He has not seen any interval stone material passage. He denies any recurrence of left-sided pain and discomfort. Voiding at his baseline with stable non bothersome symptomatology. He has not had any increase in frequency/urgency, burning or painful urination, visible blood in the urine. He denies any postprocedure fevers or chills, nausea/vomiting.   The stone was on the left side. He had ESWL for treatment of his renal calculi. Patient denies Stent, Ureteroscopy, and PCNL. This procedure was done 12/20/2019. He did not pass a stone since the last office visit. He does not have a stent in place.   He is not currently having flank pain, back pain, groin pain, nausea, vomiting, fever or chills.   He does not have dysuria. He does not have urgency. He does not have frequency.     ALLERGIES: No Allergies    MEDICATIONS: Tamsulosin Hcl 0.4 mg capsule 1 capsule PO Daily  Aspirin 81 MG TABS Oral  Multivitamin/Iron TABS Oral  Probiotic CAPS Oral  Vitamin D CAPS Oral     GU PSH: ESWL, Left - 12/20/2019 Extensive Prostate Surgery - 2009       [place] Notes: Repair Of Retinal Detachment By Laser, Prostatectomy Retropubic Radical With Lymph Node Biopsy(S), Radiation Therapy   NON-GU PSH: Repair Detached Retina - 2011     GU PMH: Renal calculus (Stable) - 12/12/2019, Renal stone, - 2016 Renal cyst, He has a stable appearing Bosniak II cyst in the left kidney. This is a  known finding. 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, Testicular atrophy - 2014 Hemorrhoids, Unspec, Hemorrhoids With Bleeding - 2014 Stress Incontinence, Male Stress Incontinence - 2014      PMH Notes:  2009-10-29 10:10:34 - Note: Retinal Tear  2006-02-01 10:04:05 - Note: Arthritis   NON-GU PMH: Encounter for general adult medical examination without abnormal findings, Encounter for preventive health examination - 2016 Decreased libido, Decreased libido - 2014 Personal history of other endocrine, nutritional and metabolic disease, History of hypercholesterolemia - 2014    FAMILY HISTORY: Cancer - Sister Death In The Family Father - Father Death In The Family Mother - Mother Family Health Status Number - Runs In Family   SOCIAL HISTORY: Marital Status: Married Preferred Language: English; Ethnicity: Not Hispanic Or Latino; Race: White     Notes: Never A Smoker, Tobacco Use, Caffeine Use, Occupation:, Alcohol Use, Marital History - Currently Married   REVIEW OF SYSTEMS:    GU Review Male:   Patient denies frequent urination, hard to postpone urination, burning/ pain with urination, get up at night to urinate, leakage of urine, stream starts and stops, trouble starting your stream, have to strain to urinate , erection problems, and penile pain.  Gastrointestinal (Upper):  Patient denies nausea, vomiting, and indigestion/ heartburn.  Gastrointestinal (Lower):   Patient denies diarrhea and constipation.  Constitutional:   Patient denies fever, night sweats, weight loss, and fatigue.  Skin:   Patient denies skin rash/  lesion and itching.  Eyes:   Patient denies blurred vision and double vision.  Ears/ Nose/ Throat:   Patient denies sore throat and sinus problems.  Hematologic/Lymphatic:   Patient denies swollen glands and easy bruising.  Cardiovascular:   Patient denies leg swelling and chest pains.  Respiratory:   Patient denies cough and shortness of breath.  Endocrine:   Patient denies excessive thirst.  Musculoskeletal:   Patient denies back pain and joint pain.  Neurological:   Patient denies headaches and dizziness.  Psychologic:   Patient denies depression and anxiety.   VITAL SIGNS:      01/03/2020 02:18 PM  Weight 150 lb / 68.04 kg  Height 69 in / 175.26 cm  BP 133/69 mmHg  Pulse 55 /min  Temperature 97.7 F / 36.5 C  BMI 22.1 kg/m   MULTI-SYSTEM PHYSICAL EXAMINATION:    Constitutional: Well-nourished. No physical deformities. Normally developed. Good grooming.  Neck: Neck symmetrical, not swollen. Normal tracheal position.  Respiratory: No labored breathing, no use of accessory muscles.   Cardiovascular: Normal temperature, normal extremity pulses, no swelling, no varicosities.  Skin: No paleness, no jaundice, no cyanosis. No lesion, no ulcer, no rash.  Neurologic / Psychiatric: Oriented to time, oriented to place, oriented to person. No depression, no anxiety, no agitation.  Gastrointestinal: No mass, no tenderness, no rigidity, non obese abdomen. No CVA or flank tenderness.  Musculoskeletal: Normal gait and station of head and neck.     Complexity of Data:  Source Of History:  Patient, Medical Record Summary  Records Review:   Previous Doctor Records, Previous Hospital Records, Previous Patient Records  Urine Test Review:   Urinalysis, Urine Culture  X-Ray Review: [facility]: Reviewed Films. Discussed With Patient.     03/23/17 03/17/16 01/22/15 01/17/14 11/29/12 11/03/11 11/03/10 10/22/09  PSA  Total PSA <0.015 ng/mL < 0.015 ng/dl <0.01  <0.01  <0.01  <0.01  0.01  0.00     01/03/20   Urinalysis  Urine Appearance Clear   Urine Color Yellow   Urine Glucose Neg mg/dL  Urine Bilirubin Neg mg/dL  Urine Ketones Neg mg/dL  Urine Specific Gravity 1.020   Urine Blood Neg ery/uL  Urine pH 5.5   Urine Protein Neg mg/dL  Urine Urobilinogen 0.2 mg/dL  Urine Nitrites Neg   Urine Leukocyte Esterase Neg leu/uL   PROCEDURES:         KUB - [id number]  A single view of the abdomen is obtained. Previously identified distal left ureteral calculi grossly remains unchanged in size, shape as well as complexity compared to previous office [facility] examination. Athough possibly mistaken for a proximal left ureteral calculi, the ovoid shaped opacity lateral to L2/L3 lumbar vertebrae is likely the previously identified lower pole calculi noted on prior CT imaging. Appearance on today's exam likely reflective of his severe but stable degenerative and scolitic changes along the lumbar spine.      Patient confirmed No Neulasta OnPro Device.            Urinalysis Dipstick Dipstick Cont'd  Color: Yellow Bilirubin: Neg mg/dL  Appearance: Clear Ketones: Neg mg/dL  Specific Gravity: 1.020 Blood: Neg ery/uL  pH: 5.5 Protein: Neg mg/dL  Glucose: Neg mg/dL Urobilinogen: 0.2 mg/dL    Nitrites: Neg  Leukocyte Esterase: Neg leu/uL    ASSESSMENT:      ICD-10 Details  1 GU:   Ureteral calculus - N20.1 Left, Acute, Threat to Bodily Function   PLAN:           Orders Labs Urine Culture          Schedule Return Visit/Planned Activity: Next Available Appointment - Follow up MD, Schedule Surgery          Document Letter(s):  Created for Patient: Clinical Summary         Notes:   Distal left ureteral calculi remains on today's K[facility] study. He has not had interval stone material passage but fortunately remains asymptomatic voiding at his baseline without interval recurrence of left-sided pain or discomfort. Urinalysis is clear today. As indicated by previous messages from his urologist, ureteroscopy  is likely indicated in this situation. We discussed this in detail today.   For ureteroscopy I described the risks which include heart attack, stroke, pulmonary embolus, death, bleeding, infection, damage to contiguous structures, positioning injury, ureteral stricture, ureteral avulsion, ureteral injury, need for ureteral stent, inability to perform ureteroscopy, need for an interval procedure, inability to clear stone burden, stent discomfort and pain.   All questions answered to the best my ability about ureteroscopy and expected postoperative course with understanding expressed by the patient. I will send a precautionary urine culture today as well. I will confirm with his urologist about going ahead and setting up for ureteroscopy. Appropriate paperwork will be completed in turned into the hospital scheduler and patient will be scheduled appropriately.

## 2020-01-22 ENCOUNTER — Ambulatory Visit (HOSPITAL_BASED_OUTPATIENT_CLINIC_OR_DEPARTMENT_OTHER): Payer: PPO | Admitting: Anesthesiology

## 2020-01-22 ENCOUNTER — Encounter (HOSPITAL_BASED_OUTPATIENT_CLINIC_OR_DEPARTMENT_OTHER)
Admission: RE | Disposition: A | Discharge status: Home / Self Care | Payer: Self-pay | Source: Home / Self Care | Attending: Urology

## 2020-01-22 ENCOUNTER — Other Ambulatory Visit: Payer: Self-pay

## 2020-01-22 ENCOUNTER — Encounter (HOSPITAL_BASED_OUTPATIENT_CLINIC_OR_DEPARTMENT_OTHER): Payer: Self-pay | Admitting: Urology

## 2020-01-22 ENCOUNTER — Ambulatory Visit (HOSPITAL_BASED_OUTPATIENT_CLINIC_OR_DEPARTMENT_OTHER)
Admission: RE | Admit: 2020-01-22 | Discharge: 2020-01-22 | Disposition: A | Discharge status: Home / Self Care | Payer: PPO | Attending: Urology | Admitting: Urology

## 2020-01-22 DIAGNOSIS — Z79899 Other long term (current) drug therapy: Secondary | ICD-10-CM | POA: Insufficient documentation

## 2020-01-22 DIAGNOSIS — Z87442 Personal history of urinary calculi: Secondary | ICD-10-CM | POA: Insufficient documentation

## 2020-01-22 DIAGNOSIS — N200 Calculus of kidney: Secondary | ICD-10-CM | POA: Diagnosis not present

## 2020-01-22 DIAGNOSIS — E559 Vitamin D deficiency, unspecified: Secondary | ICD-10-CM | POA: Diagnosis not present

## 2020-01-22 DIAGNOSIS — N202 Calculus of kidney with calculus of ureter: Secondary | ICD-10-CM | POA: Diagnosis not present

## 2020-01-22 DIAGNOSIS — K219 Gastro-esophageal reflux disease without esophagitis: Secondary | ICD-10-CM | POA: Diagnosis not present

## 2020-01-22 DIAGNOSIS — N201 Calculus of ureter: Secondary | ICD-10-CM | POA: Diagnosis not present

## 2020-01-22 DIAGNOSIS — J449 Chronic obstructive pulmonary disease, unspecified: Secondary | ICD-10-CM | POA: Diagnosis not present

## 2020-01-22 HISTORY — DX: Personal history of urinary calculi: Z87.442

## 2020-01-22 HISTORY — DX: Calculus of ureter: N20.1

## 2020-01-22 HISTORY — DX: Unspecified hemorrhoids: K64.9

## 2020-01-22 HISTORY — DX: Personal history of other diseases of the nervous system and sense organs: Z86.69

## 2020-01-22 HISTORY — DX: Frequency of micturition: R35.0

## 2020-01-22 HISTORY — DX: Presence of dental prosthetic device (complete) (partial): Z97.2

## 2020-01-22 HISTORY — DX: Personal history of other diseases of the digestive system: Z87.19

## 2020-01-22 HISTORY — DX: Personal history of irradiation: Z92.3

## 2020-01-22 HISTORY — DX: Personal history of other specified conditions: Z87.898

## 2020-01-22 HISTORY — PX: CYSTOSCOPY/URETEROSCOPY/HOLMIUM LASER/STENT PLACEMENT: SHX6546

## 2020-01-22 HISTORY — DX: Personal history of malignant neoplasm of prostate: Z85.46

## 2020-01-22 SURGERY — CYSTOSCOPY/URETEROSCOPY/HOLMIUM LASER/STENT PLACEMENT
Anesthesia: General | Laterality: Left

## 2020-01-22 MED ORDER — ACETAMINOPHEN 325 MG RE SUPP: 650.0000 mg | RECTAL | Status: DC | PRN

## 2020-01-22 MED ORDER — ACETAMINOPHEN 325 MG PO TABS: 650.0000 mg | ORAL_TABLET | ORAL | Status: DC | PRN

## 2020-01-22 MED ORDER — PROPOFOL 10 MG/ML IV BOLUS
INTRAVENOUS | Status: AC
  Filled 2020-01-22: qty 20

## 2020-01-22 MED ORDER — MEPERIDINE HCL 25 MG/ML IJ SOLN: 6.2500 mg | INTRAMUSCULAR | Status: DC | PRN

## 2020-01-22 MED ORDER — SODIUM CHLORIDE 0.9% FLUSH: 3.0000 mL | INTRAVENOUS | Status: DC | PRN

## 2020-01-22 MED ORDER — ONDANSETRON HCL 4 MG/2ML IJ SOLN
INTRAMUSCULAR | Status: DC | PRN
  Administered 2020-01-22: 4 mg via INTRAVENOUS

## 2020-01-22 MED ORDER — OXYCODONE HCL 5 MG/5ML PO SOLN: 5.0000 mg | Freq: Once | ORAL | Status: DC | PRN

## 2020-01-22 MED ORDER — PROPOFOL 10 MG/ML IV BOLUS
INTRAVENOUS | Status: DC | PRN
  Administered 2020-01-22: 110 mg via INTRAVENOUS

## 2020-01-22 MED ORDER — FENTANYL CITRATE (PF) 100 MCG/2ML IJ SOLN
INTRAMUSCULAR | Status: DC | PRN
  Administered 2020-01-22: 50 ug via INTRAVENOUS
  Administered 2020-01-22 (×2): 25 ug via INTRAVENOUS

## 2020-01-22 MED ORDER — FENTANYL CITRATE (PF) 100 MCG/2ML IJ SOLN: 25.0000 ug | INTRAMUSCULAR | Status: DC | PRN

## 2020-01-22 MED ORDER — ACETAMINOPHEN 160 MG/5ML PO SOLN
325.0000 mg | ORAL | Status: DC | PRN
Start: 2020-01-22 — End: 2020-01-22

## 2020-01-22 MED ORDER — CEFAZOLIN SODIUM-DEXTROSE 2-4 GM/100ML-% IV SOLN
INTRAVENOUS | Status: AC
  Filled 2020-01-22: qty 100

## 2020-01-22 MED ORDER — ACETAMINOPHEN 325 MG PO TABS
325.0000 mg | ORAL_TABLET | ORAL | Status: DC | PRN
Start: 2020-01-22 — End: 2020-01-22

## 2020-01-22 MED ORDER — LACTATED RINGERS IV SOLN: INTRAVENOUS | Status: DC

## 2020-01-22 MED ORDER — LIDOCAINE HCL (CARDIAC) PF 100 MG/5ML IV SOSY
PREFILLED_SYRINGE | INTRAVENOUS | Status: DC | PRN
  Administered 2020-01-22: 50 mg via INTRAVENOUS

## 2020-01-22 MED ORDER — OXYCODONE HCL 5 MG PO TABS: 5.0000 mg | ORAL_TABLET | Freq: Once | ORAL | Status: DC | PRN

## 2020-01-22 MED ORDER — CEFAZOLIN SODIUM-DEXTROSE 2-4 GM/100ML-% IV SOLN
2.0000 g | INTRAVENOUS | Status: AC
  Administered 2020-01-22: 2 g via INTRAVENOUS

## 2020-01-22 MED ORDER — DEXAMETHASONE SODIUM PHOSPHATE 10 MG/ML IJ SOLN
INTRAMUSCULAR | Status: AC
  Filled 2020-01-22: qty 1

## 2020-01-22 MED ORDER — LIDOCAINE HCL (PF) 2 % IJ SOLN
INTRAMUSCULAR | Status: AC
  Filled 2020-01-22: qty 5

## 2020-01-22 MED ORDER — OXYCODONE HCL 5 MG PO TABS
5.0000 mg | ORAL_TABLET | ORAL | Status: DC | PRN
Start: 2020-01-22 — End: 2020-01-22

## 2020-01-22 MED ORDER — SODIUM CHLORIDE 0.9 % IV SOLN: 250.0000 mL | INTRAVENOUS | Status: DC | PRN

## 2020-01-22 MED ORDER — FENTANYL CITRATE (PF) 100 MCG/2ML IJ SOLN
INTRAMUSCULAR | Status: AC
  Filled 2020-01-22: qty 2

## 2020-01-22 MED ORDER — DEXAMETHASONE SODIUM PHOSPHATE 10 MG/ML IJ SOLN
INTRAMUSCULAR | Status: DC | PRN
  Administered 2020-01-22: 4 mg via INTRAVENOUS

## 2020-01-22 MED ORDER — SODIUM CHLORIDE 0.9% FLUSH: 3.0000 mL | Freq: Two times a day (BID) | INTRAVENOUS | Status: DC

## 2020-01-22 MED ORDER — EPHEDRINE 5 MG/ML INJ
INTRAVENOUS | Status: AC
  Filled 2020-01-22: qty 10

## 2020-01-22 MED ORDER — EPHEDRINE SULFATE 50 MG/ML IJ SOLN
INTRAMUSCULAR | Status: DC | PRN
Start: 2020-01-22 — End: 2020-01-22
  Administered 2020-01-22: 10 mg via INTRAVENOUS

## 2020-01-22 MED ORDER — ONDANSETRON HCL 4 MG/2ML IJ SOLN: 4.0000 mg | Freq: Once | INTRAMUSCULAR | Status: DC | PRN

## 2020-01-22 MED ORDER — ONDANSETRON HCL 4 MG/2ML IJ SOLN
INTRAMUSCULAR | Status: AC
  Filled 2020-01-22: qty 2

## 2020-01-22 MED ORDER — MORPHINE SULFATE (PF) 4 MG/ML IV SOLN: 2.0000 mg | INTRAVENOUS | Status: DC | PRN

## 2020-01-22 MED ORDER — SODIUM CHLORIDE 0.9 % IR SOLN
Status: DC | PRN
  Administered 2020-01-22: 3000 mL via INTRAVESICAL

## 2020-01-22 MED ORDER — IOHEXOL 300 MG/ML  SOLN
INTRAMUSCULAR | Status: DC | PRN
  Administered 2020-01-22: 2 mL via URETHRAL

## 2020-01-22 SURGICAL SUPPLY — 28 items
BAG DRAIN URO-CYSTO SKYTR STRL (DRAIN) ×2 IMPLANT
BAG DRN UROCATH (DRAIN) ×1
BASKET STONE 1.7 NGAGE (UROLOGICAL SUPPLIES) ×1 IMPLANT
BASKET ZERO TIP NITINOL 2.4FR (BASKET) IMPLANT
BSKT STON RTRVL ZERO TP 2.4FR (BASKET)
CATH URET 5FR 28IN CONE TIP (BALLOONS)
CATH URET 5FR 28IN OPEN ENDED (CATHETERS) ×1 IMPLANT
CATH URET 5FR 70CM CONE TIP (BALLOONS) IMPLANT
CLOTH BEACON ORANGE TIMEOUT ST (SAFETY) ×2 IMPLANT
DRSG TEGADERM 2-3/8X2-3/4 SM (GAUZE/BANDAGES/DRESSINGS) ×2 IMPLANT
ELECT REM PT RETURN 9FT ADLT (ELECTROSURGICAL)
ELECTRODE REM PT RTRN 9FT ADLT (ELECTROSURGICAL) IMPLANT
FIBER LASER FLEXIVA 365 (UROLOGICAL SUPPLIES) IMPLANT
GLOVE SURG SS PI 8.0 STRL IVOR (GLOVE) ×2 IMPLANT
GOWN STRL REUS W/TWL XL LVL3 (GOWN DISPOSABLE) ×2 IMPLANT
GUIDEWIRE ANG ZIPWIRE 038X150 (WIRE) IMPLANT
GUIDEWIRE STR DUAL SENSOR (WIRE) ×2 IMPLANT
IV NS IRRIG 3000ML ARTHROMATIC (IV SOLUTION) ×2 IMPLANT
KIT TURNOVER CYSTO (KITS) ×2 IMPLANT
MANIFOLD NEPTUNE II (INSTRUMENTS) ×2 IMPLANT
NS IRRIG 500ML POUR BTL (IV SOLUTION) ×2 IMPLANT
PACK CYSTO (CUSTOM PROCEDURE TRAY) ×2 IMPLANT
SHEATH URET ACCESS 12FR/35CM (UROLOGICAL SUPPLIES) ×1 IMPLANT
STENT URET 6FRX24 CONTOUR (STENTS) ×1 IMPLANT
TRACTIP FLEXIVA PULS ID 200XHI (Laser) IMPLANT
TRACTIP FLEXIVA PULSE ID 200 (Laser) ×2
TUBE CONNECTING 12X1/4 (SUCTIONS) ×2 IMPLANT
TUBING UROLOGY SET (TUBING) ×1 IMPLANT

## 2020-01-22 NOTE — Op Note (Signed)
Procedure: 1.  Cystoscopy with left distal ureteroscopic stone extraction with holmium laser application. 2.  Left renal ureteroscopic stone extraction with holmium laser application and insertion of left double-J stent with tether. 3.  Application of fluoroscopy.  Preop diagnosis: Left distal ureteral stone and left renal stone.  Postop diagnosis: Same.  Surgeon: Dr. Irine Seal.  Anesthesia: General.  Specimen: Stone fragments.  Drains: Left 6 French by 24 cm contour double-J stent with tether.  EBL: None.  Complications: None.  Indications: The patient is an 87 year old male with a history of a 7 x 4 mm left distal ureteral stone and a 9 x 5 mm left UPJ stone who had previously undergone lithotripsy of the left distal ureteral stone without impact on the stone.  Presents now for ureteroscopic management.  Procedure: He was given 2 g of Ancef.  General anesthetic was induced.  He was placed in lithotomy position and fitted with PAS hose.  His perineum and genitalia were prepped with Betadine solution he was draped in usual sterile fashion.  Cystoscopy was performed using the 23 [place] scope and 30 degree lens.  Examination revealed a normal urethra.  The external sphincter was intact.  The prostate had been surgically removed and there was no bladder neck contracture.  Inspection of bladder revealed mild trabeculation.  He had mucosal changes consistent with radiation cystitis but no tumors or stones were identified.  Ureteral orifices were in the normal anatomic position.  A 5 French open ended catheter was passed and a sensor wire was then advanced through the open-ended catheter up the left ureter by the distal stone and the UPJ stone into the kidney under fluoroscopic guidance.  The cystoscope was removed and the inner core of a 12/14 French 35 cm digital access sheath was used to dilate the distal ureter beyond the stone.  A single lumen semirigid ureteroscope was then passed and  the stone was identified.  The stone was then engaged with a 200 m holmium laser fiber with the initial settings of 0.5 J and 10 Hz and a final setting of 1 J and 20 Hz.  The stone broke into manageable fragments which were then removed using an engage basket.  The fragments were relocated to the bladder.  Once all significant fragments had been removed, the ureteroscope was removed and the cystoscope was reinserted.  The fragments were removed.  The access sheath was then reassembled and the assembled sheath was advanced over the wire without difficulty to the proximal ureter.  The inner core and wire were removed and a dual-lumen digital flexible ureteroscope was then advanced to the kidney.  The stone was identified in the renal pelvis and was once again fragmented with the holmium laser using the 200 m fiber and the initial settings of 1 J and 20 Hz and final settings of point 8 J and 20 Hz.  The stone fragments were then removed with the engage basket leaving only sand and grit in the collecting system.  Final inspection of the entire collecting system fluoroscopically and visually with ureteroscope revealed no significant residual fragments.  A sensor wire was then advanced through the scope to the kidney and the scope and sheath were removed under direct vision.  There were some ureteral irritation but no significant injury.  Once the scope had been removed, the cystoscope was reinserted over the wire and a 6 [place] by 24 cm contour double-J stent with tether was advanced the kidney under fluoroscopic guidance.  Wire  was removed, leaving a good coil in the kidney and a good coil in the bladder.  The bladder was drained and the cystoscope was removed leaving the stent string exiting urethra.  The string was secured to the patient's penis.  He was taken down from lithotomy position, his anesthetic was reversed and he was moved recovery in stable condition.  There were no complications.

## 2020-01-22 NOTE — Transfer of Care (Signed)
Immediate Anesthesia Transfer of Care Note  Patient: Dennis Reynolds  Procedure(s) Performed: CYSTOSCOPY LEFT URETEROSCOPY/HOLMIUM LASER/STENT PLACEMENT LEFT RETROGRADE STONE BASKET EXTRATION (Left )  Patient Location: PACU  Anesthesia Type:General  Level of Consciousness: awake, alert , oriented and patient cooperative  Airway & Oxygen Therapy: Patient Spontanous Breathing and Patient connected to face mask oxygen  Post-op Assessment: Report given to RN, Post -op Vital signs reviewed and stable and Patient moving all extremities X 4  Post vital signs: Reviewed and stable  Last Vitals:  Vitals Value Taken Time  BP 125/94 01/22/20 1211  Temp 36.4 C 01/22/20 1211  Pulse 65 01/22/20 1211  Resp 14 01/22/20 1211  SpO2 96 % 01/22/20 1211    Last Pain:  Vitals:   01/22/20 1211  TempSrc:   PainSc: 0-No pain         Complications: No complications documented.

## 2020-01-22 NOTE — Anesthesia Postprocedure Evaluation (Signed)
Anesthesia Post Note  Patient: Dennis Reynolds  Procedure(s) Performed: CYSTOSCOPY LEFT URETEROSCOPY/HOLMIUM LASER/STENT PLACEMENT LEFT RETROGRADE STONE BASKET EXTRATION (Left )     Patient location during evaluation: PACU Anesthesia Type: General Level of consciousness: sedated Pain management: pain level controlled Vital Signs Assessment: post-procedure vital signs reviewed and stable Respiratory status: spontaneous breathing and respiratory function stable Cardiovascular status: stable Postop Assessment: no apparent nausea or vomiting Anesthetic complications: no   No complications documented.  Last Vitals:  Vitals:   01/22/20 1142 01/22/20 1145  BP: 133/80   Pulse:  68  Resp:  12  Temp:    SpO2:  94%    Last Pain:  Vitals:   01/22/20 1115  TempSrc:   PainSc: 0-No pain                 Mackenzi Krogh,Arron DANIEL

## 2020-01-22 NOTE — Anesthesia Procedure Notes (Signed)
Procedure Name: LMA Insertion Date/Time: 01/22/2020 9:47 AM Performed by: Jonna Munro, CRNA Pre-anesthesia Checklist: Patient identified, Emergency Drugs available, Suction available, Patient being monitored and Timeout performed Patient Re-evaluated:Patient Re-evaluated prior to induction Oxygen Delivery Method: Circle system utilized Preoxygenation: Pre-oxygenation with 100% oxygen Induction Type: IV induction LMA: LMA inserted LMA Size: 5.0 Number of attempts: 1 Placement Confirmation: positive ETCO2 and breath sounds checked- equal and bilateral Dental Injury: Teeth and Oropharynx as per pre-operative assessment

## 2020-01-22 NOTE — Discharge Instructions (Signed)
Ureteral Stent Implantation, Care After This sheet gives you information about how to care for yourself after your procedure. Your health care provider may also give you more specific instructions. If you have problems or questions, contact your health care provider. What can I expect after the procedure? After the procedure, it is common to have:  Nausea.  Mild pain when you urinate. You may feel this pain in your lower back or lower abdomen. The pain should stop within a few minutes after you urinate. This may last for up to 1 week.  A small amount of blood in your urine for several days. Follow these instructions at home: Medicines  Take over-the-counter and prescription medicines only as told by your health care provider.  If you were prescribed an antibiotic medicine, take it as told by your health care provider. Do not stop taking the antibiotic even if you start to feel better.  Do not drive for 24 hours if you were given a sedative during your procedure.  Ask your health care provider if the medicine prescribed to you requires you to avoid driving or using heavy machinery. Activity  Rest as told by your health care provider.  Avoid sitting for a long time without moving. Get up to take short walks every 1-2 hours. This is important to improve blood flow and breathing. Ask for help if you feel weak or unsteady.  Return to your normal activities as told by your health care provider. Ask your health care provider what activities are safe for you. General instructions  Watch for any blood in your urine. Call your health care provider if the amount of blood in your urine increases.  If you have a catheter: ? Follow instructions from your health care provider about taking care of your catheter and collection bag. ? Do not take baths, swim, or use a hot tub until your health care provider approves. Ask your health care provider if you may take showers. You may only be allowed to  take sponge baths.  Drink enough fluid to keep your urine pale yellow.  Do not use any products that contain nicotine or tobacco, such as cigarettes, e-cigarettes, and chewing tobacco. These can delay healing after surgery. If you need help quitting, ask your health care provider.  Keep all follow-up visits as told by your health care provider. This is important.   Contact a health care provider if:  You have pain that gets worse or does not get better with medicine, especially pain when you urinate.  You have difficulty urinating.  You feel nauseous or you vomit repeatedly during a period of more than 2 days after the procedure. Get help right away if:  Your urine is dark red or has blood clots in it.  You are leaking urine (have incontinence).  The end of the stent comes out of your urethra.  You cannot urinate.  You have sudden, sharp, or severe pain in your abdomen or lower back.  You have a fever.  You have swelling or pain in your legs.  You have difficulty breathing. Summary  After the procedure, it is common to have mild pain when you urinate that goes away within a few minutes after you urinate. This may last for up to 1 week.  Watch for any blood in your urine. Call your health care provider if the amount of blood in your urine increases.  Take over-the-counter and prescription medicines only as told by your health care provider.    Drink enough fluid to keep your urine pale yellow. This information is not intended to replace advice given to you by your health care provider. Make sure you discuss any questions you have with your health care provider. Document Revised: 09/27/2017 Document Reviewed: 09/28/2017 Elsevier Patient Education  2021 [employer]  

## 2020-01-22 NOTE — Interval H&P Note (Signed)
History and Physical Interval Note:  01/22/2020 9:26 AM  Dennis Reynolds JR  has presented today for surgery, with the diagnosis of LEFT URETERAL STONE.  The various methods of treatment have been discussed with the patient and family. After consideration of risks, benefits and other options for treatment, the patient has consented to  Procedure(s): CYSTOSCOPY LEFT URETEROSCOPY/HOLMIUM LASER/STENT PLACEMENT (Left) as a surgical intervention.  The patient's history has been reviewed, patient examined, no change in status, stable for surgery.  I have reviewed the patient's chart and labs.  Questions were answered to the patient's satisfaction.     Irine Seal

## 2020-01-23 ENCOUNTER — Encounter (HOSPITAL_BASED_OUTPATIENT_CLINIC_OR_DEPARTMENT_OTHER): Payer: Self-pay | Admitting: Urology

## 2020-01-29 DIAGNOSIS — N201 Calculus of ureter: Secondary | ICD-10-CM | POA: Diagnosis not present

## 2020-01-29 DIAGNOSIS — N200 Calculus of kidney: Secondary | ICD-10-CM | POA: Diagnosis not present

## 2020-03-13 ENCOUNTER — Ambulatory Visit: Payer: PPO | Admitting: Neurology

## 2020-04-02 ENCOUNTER — Other Ambulatory Visit: Payer: Self-pay

## 2020-04-02 ENCOUNTER — Ambulatory Visit (INDEPENDENT_AMBULATORY_CARE_PROVIDER_SITE_OTHER)
Admission: RE | Admit: 2020-04-02 | Discharge: 2020-04-02 | Disposition: A | Discharge status: Home / Self Care | Payer: PPO | Source: Ambulatory Visit | Attending: Pulmonary Disease | Admitting: Pulmonary Disease

## 2020-04-02 DIAGNOSIS — R911 Solitary pulmonary nodule: Secondary | ICD-10-CM | POA: Diagnosis not present

## 2020-04-02 DIAGNOSIS — J984 Other disorders of lung: Secondary | ICD-10-CM | POA: Diagnosis not present

## 2020-04-02 DIAGNOSIS — I700 Atherosclerosis of aorta: Secondary | ICD-10-CM | POA: Diagnosis not present

## 2020-04-02 DIAGNOSIS — J8410 Pulmonary fibrosis, unspecified: Secondary | ICD-10-CM | POA: Diagnosis not present

## 2020-04-02 DIAGNOSIS — I2510 Atherosclerotic heart disease of native coronary artery without angina pectoris: Secondary | ICD-10-CM | POA: Diagnosis not present

## 2020-04-03 ENCOUNTER — Encounter: Payer: Self-pay | Admitting: Adult Health

## 2020-04-03 ENCOUNTER — Ambulatory Visit: Payer: PPO | Admitting: Adult Health

## 2020-04-03 VITALS — BP 120/80 | HR 69 | Temp 97.9°F | Ht 68.0 in | Wt 156.0 lb

## 2020-04-03 DIAGNOSIS — R911 Solitary pulmonary nodule: Secondary | ICD-10-CM | POA: Diagnosis not present

## 2020-04-03 NOTE — Progress Notes (Signed)
@Patient  ID: Dennis Reynolds, male    DOB: May 16, 1931, 87 y.o.   MRN: [id number]  Chief Complaint  Patient presents with  . Follow-up    Referring provider: Shon Baton, MD  HPI: 87 year old male never smoker seen for pulmonary consult December 2021 for lung nodule Retired IT trainer   TEST/EVENTS :  12/10/2019 CT abdomen and pelvis: There is a 9 mm noncalcified pulmonary nodule that is on the lateral aspect of the left lung subpleural in nature  04/03/2020 Follow up : Lung nodule  Patient was seen last visit in December 2021 for a pulmonary consult.  Patient had a CT abdomen and pelvis evaluating kidney stones.  This showed an incidental finding of a 9 mm left subpleural nodule.  Patient was recommended for a follow-up CT which was completed this morning that showed biapical pleural-parenchymal scarring.  Calcified granulomas noted in the right upper and left lower lobe.  No suspicious pulmonary nodules or masses noted.  A stable 9 mm left lower lobe pulmonary nodule was noted. We reviewed his CT scan results. Feeling well. Remains at home (he built their house) , independent , drives.   No Known Allergies  Immunization History  Administered Date(s) Administered  . Influenza, High Dose Seasonal PF 10/31/2017, 10/06/2018  . PFIZER(Purple Top)SARS-COV-2 Vaccination 01/28/2019, 02/25/2019, 10/04/2019  . Pneumococcal Conjugate-13 04/05/2015    Past Medical History:  Diagnosis Date  . Arthritis   . BPH (benign prostatic hyperplasia)   . COPD (chronic obstructive pulmonary disease) ([place])   . Diverticulosis   . ED (erectile dysfunction)   . ED (erectile dysfunction)   . Frequency of urination   . GERD (gastroesophageal reflux disease)   . Hemorrhoids   . History of chronic otitis media   . History of confusion neruologist--- dr Tomi Likens   episode transient confusion 12/ 2020  . History of duodenal ulcer 2013   due to H.Pylori, treated  . History of esophageal stricture     s/p dilatation 2013;  2018  . History of external beam radiation therapy    prostate  . History of kidney stones   . 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 History of prostate cancer urologist-- dr Jeffie Pollock   06-27-2001  s/p  radical prostatectomy  . Hyperlipidemia   . Left ureteral stone   . Neuropathy    LLE  . Nodule of lower lobe of left lung 12/2019   pulmonology--- dr b. Valeta Harms--  repeat chest ct 01-08-2020 , just monitor  . Osteopenia   . Prostate cancer ([place]) 2003  . Sinus bradycardia   . Vitamin D deficiency   . Wears partial dentures    upper    Tobacco History: Social History   Tobacco Use  Smoking Status Never Smoker  Smokeless Tobacco Never Used   Counseling given: Not Answered   Outpatient Medications Prior to Visit  Medication Sig Dispense Refill  . alendronate (FOSAMAX) 70 MG tablet Take 70 mg by mouth once a week. Take with a full glass of water on an empty stomach. (Patient not taking: Reported on 04/03/2020)    . aspirin EC 325 MG tablet Take 325 mg by mouth every 4 (four) hours as needed for mild pain. (Patient not taking: Reported on 04/03/2020)    . HYDROcodone-acetaminophen (NORCO/VICODIN) 5-325 MG tablet Take 1 tablet by mouth every 6 (six) hours as needed for severe pain. (Patient not taking: No sig reported) 10 tablet 0  . ondansetron (ZOFRAN  ODT) 4 MG disintegrating tablet Take 1 tablet (4 mg total) by mouth every 8 (eight) hours as needed for nausea or vomiting. (Patient not taking: No sig reported) 20 tablet 0  . tamsulosin (FLOMAX) 0.4 MG CAPS capsule Take 1 capsule (0.4 mg total) by mouth daily. (Patient not taking: Reported on 04/03/2020) 7 capsule 0   No facility-administered medications prior to visit.     Review of Systems:   Constitutional:   No  weight loss, night sweats,  Fevers, chills,  +fatigue, or  lassitude.  HEENT:   No headaches,  Difficulty swallowing,  Tooth/dental problems, or  Sore throat,                No sneezing,  itching, ear ache, nasal congestion, post nasal drip,   CV:  No chest pain,  Orthopnea, PND, swelling in lower extremities, anasarca, dizziness, palpitations, syncope.   GI  No heartburn, indigestion, abdominal pain, nausea, vomiting, diarrhea, change in bowel habits, loss of appetite, bloody stools.   Resp: No shortness of breath with exertion or at rest.  No excess mucus, no productive cough,  No non-productive cough,  No coughing up of blood.  No change in color of mucus.  No wheezing.  No chest wall deformity  Skin: no rash or lesions.  GU: no dysuria, change in color of urine, no urgency or frequency.  No flank pain, no hematuria   MS:  No joint pain or swelling.  No decreased range of motion.  No back pain.    Physical Exam  BP 120/80 (BP Location: Left Arm, Patient Position: Sitting, Cuff Size: Normal)   Pulse 69   Temp 97.9 F (36.6 C) (Temporal)   Ht 5\' 8"  (1.727 m)   Wt 156 lb (70.8 kg)   SpO2 97%   BMI 23.72 kg/m   GEN: A/Ox3; pleasant , NAD, well nourished    HEENT:  [place]/AT,  EACs-clear, TMs-wnl, NOSE-clear, THROAT-clear, no lesions, no postnasal drip or exudate noted.   NECK:  Supple w/ fair ROM; no JVD; normal carotid impulses w/o bruits; no thyromegaly or nodules palpated; no lymphadenopathy.    RESP  Clear  P & A; w/o, wheezes/ rales/ or rhonchi. no accessory muscle use, no dullness to percussion  CARD:  RRR, no m/r/g, no peripheral edema, pulses intact, no cyanosis or clubbing.  GI:   Soft & nt; nml bowel sounds; no organomegaly or masses detected.   Musco: Warm bil, no deformities or joint swelling noted.   Neuro: alert, no focal deficits noted.    Skin: Warm, no lesions or rashes    Lab Results:  CBC  BNP No results found for: BNP  ProBNP No results found for: PROBNP  Imaging: CT Super D Chest Wo Contrast  Result Date: 04/03/2020 CLINICAL DATA:  Pulmonary nodule. EXAM: CT CHEST WITHOUT CONTRAST TECHNIQUE: Multidetector CT imaging of the  chest was performed using thin slice collimation for electromagnetic bronchoscopy planning purposes, without intravenous contrast. COMPARISON:  01/08/2020. FINDINGS: Cardiovascular: The heart size is normal. No substantial pericardial effusion. Coronary artery calcification is evident. Atherosclerotic calcification is noted in the wall of the thoracic aorta. Mediastinum/Nodes: No mediastinal lymphadenopathy. No evidence for gross hilar lymphadenopathy although assessment is limited by the lack of intravenous contrast on today's study. The esophagus has normal imaging features. There is no axillary lymphadenopathy. Lungs/Pleura: Biapical pleuroparenchymal scarring noted. Calcified granulomas noted right upper lower lobes and the left lower lobe. No suspicious pulmonary nodule or mass in the right  lung. 9 mm left lower lobe pulmonary nodule is similar to prior on image 120/series 3 today. No new suspicious pulmonary nodule or mass in the left lung. No focal airspace consolidation. There is no evidence of pleural effusion. Upper Abdomen: Tiny hypodensities in the liver cannot be definitively characterized but are likely cysts. No evidence for adrenal nodule or mass. Musculoskeletal: No worrisome lytic or sclerotic osseous abnormality. IMPRESSION: 1. Stable 9 mm left lower lobe pulmonary nodule. 2. Aortic Atherosclerosis (ICD10-I70.0). Electronically Signed   By: Misty Stanley M.D.   On: 04/03/2020 09:05      No flowsheet data found.  No results found for: NITRICOXIDE      Assessment & Plan:   Lung nodule Stable 9 mm left lower lobe lung nodule-serial CT shows no significant change.  Patient is a never smoker however has significant secondhand exposure. We will repeat CT chest in 9 months around January 2023. We discussed his test results in detail with patient and his wife.  Plan  Patient Instructions  Set up CT chest without contrast to follow left lower lung nodule in January 2023 .  Follow up  with Dr. Valeta Harms or Morell Mears NP in January 2023 after CT scan .          Rexene Edison, NP 04/03/2020

## 2020-04-03 NOTE — Patient Instructions (Signed)
Set up CT chest without contrast to follow left lower lung nodule in January 2023 .  Follow up with Dr. Valeta Harms or Babs Dabbs NP in January 2023 after CT scan .

## 2020-04-03 NOTE — Assessment & Plan Note (Signed)
Stable 9 mm left lower lobe lung nodule-serial CT shows no significant change.  Patient is a never smoker however has significant secondhand exposure. We will repeat CT chest in 9 months around January 2023. We discussed his test results in detail with patient and his wife.  Plan  Patient Instructions  Set up CT chest without contrast to follow left lower lung nodule in January 2023 .  Follow up with Dr. Valeta Harms or Alexarae Oliva NP in January 2023 after CT scan .

## 2020-04-07 NOTE — Progress Notes (Signed)
Thanks for seeing him  Garner Nash, DO [facility] 04/07/2020 7:41 AM

## 2020-04-28 DIAGNOSIS — M859 Disorder of bone density and structure, unspecified: Secondary | ICD-10-CM | POA: Diagnosis not present

## 2020-04-28 DIAGNOSIS — Z125 Encounter for screening for malignant neoplasm of prostate: Secondary | ICD-10-CM | POA: Diagnosis not present

## 2020-04-28 DIAGNOSIS — R739 Hyperglycemia, unspecified: Secondary | ICD-10-CM | POA: Diagnosis not present

## 2020-04-28 DIAGNOSIS — E785 Hyperlipidemia, unspecified: Secondary | ICD-10-CM | POA: Diagnosis not present

## 2020-05-02 DIAGNOSIS — Z8546 Personal history of malignant neoplasm of prostate: Secondary | ICD-10-CM | POA: Diagnosis not present

## 2020-05-02 DIAGNOSIS — J449 Chronic obstructive pulmonary disease, unspecified: Secondary | ICD-10-CM | POA: Diagnosis not present

## 2020-05-02 DIAGNOSIS — Z1339 Encounter for screening examination for other mental health and behavioral disorders: Secondary | ICD-10-CM | POA: Diagnosis not present

## 2020-05-02 DIAGNOSIS — E559 Vitamin D deficiency, unspecified: Secondary | ICD-10-CM | POA: Diagnosis not present

## 2020-05-02 DIAGNOSIS — M8580 Other specified disorders of bone density and structure, unspecified site: Secondary | ICD-10-CM | POA: Diagnosis not present

## 2020-05-02 DIAGNOSIS — K627 Radiation proctitis: Secondary | ICD-10-CM | POA: Diagnosis not present

## 2020-05-02 DIAGNOSIS — I700 Atherosclerosis of aorta: Secondary | ICD-10-CM | POA: Diagnosis not present

## 2020-05-02 DIAGNOSIS — R413 Other amnesia: Secondary | ICD-10-CM | POA: Diagnosis not present

## 2020-05-02 DIAGNOSIS — Z0000 Encounter for general adult medical examination without abnormal findings: Secondary | ICD-10-CM | POA: Diagnosis not present

## 2020-05-02 DIAGNOSIS — E785 Hyperlipidemia, unspecified: Secondary | ICD-10-CM | POA: Diagnosis not present

## 2020-05-02 DIAGNOSIS — K219 Gastro-esophageal reflux disease without esophagitis: Secondary | ICD-10-CM | POA: Diagnosis not present

## 2020-05-02 DIAGNOSIS — R911 Solitary pulmonary nodule: Secondary | ICD-10-CM | POA: Diagnosis not present

## 2020-05-02 DIAGNOSIS — Z1331 Encounter for screening for depression: Secondary | ICD-10-CM | POA: Diagnosis not present

## 2020-05-02 DIAGNOSIS — R739 Hyperglycemia, unspecified: Secondary | ICD-10-CM | POA: Diagnosis not present

## 2020-05-19 DIAGNOSIS — M8580 Other specified disorders of bone density and structure, unspecified site: Secondary | ICD-10-CM | POA: Diagnosis not present

## 2020-05-19 DIAGNOSIS — K219 Gastro-esophageal reflux disease without esophagitis: Secondary | ICD-10-CM | POA: Diagnosis not present

## 2020-05-19 DIAGNOSIS — Z79899 Other long term (current) drug therapy: Secondary | ICD-10-CM | POA: Diagnosis not present

## 2020-05-30 DIAGNOSIS — H35373 Puckering of macula, bilateral: Secondary | ICD-10-CM | POA: Diagnosis not present

## 2020-05-30 DIAGNOSIS — H31092 Other chorioretinal scars, left eye: Secondary | ICD-10-CM | POA: Diagnosis not present

## 2020-05-30 DIAGNOSIS — H348312 Tributary (branch) retinal vein occlusion, right eye, stable: Secondary | ICD-10-CM | POA: Diagnosis not present

## 2020-05-30 DIAGNOSIS — H353132 Nonexudative age-related macular degeneration, bilateral, intermediate dry stage: Secondary | ICD-10-CM | POA: Diagnosis not present

## 2020-07-21 DIAGNOSIS — H43813 Vitreous degeneration, bilateral: Secondary | ICD-10-CM | POA: Diagnosis not present

## 2020-07-21 DIAGNOSIS — H348312 Tributary (branch) retinal vein occlusion, right eye, stable: Secondary | ICD-10-CM | POA: Diagnosis not present

## 2020-07-21 DIAGNOSIS — H353132 Nonexudative age-related macular degeneration, bilateral, intermediate dry stage: Secondary | ICD-10-CM | POA: Diagnosis not present

## 2020-07-21 DIAGNOSIS — H35373 Puckering of macula, bilateral: Secondary | ICD-10-CM | POA: Diagnosis not present

## 2020-07-29 DIAGNOSIS — R413 Other amnesia: Secondary | ICD-10-CM | POA: Diagnosis not present

## 2020-07-29 DIAGNOSIS — R739 Hyperglycemia, unspecified: Secondary | ICD-10-CM | POA: Diagnosis not present

## 2020-07-29 DIAGNOSIS — E785 Hyperlipidemia, unspecified: Secondary | ICD-10-CM | POA: Diagnosis not present

## 2020-09-12 DIAGNOSIS — J449 Chronic obstructive pulmonary disease, unspecified: Secondary | ICD-10-CM | POA: Diagnosis not present

## 2020-09-12 DIAGNOSIS — R739 Hyperglycemia, unspecified: Secondary | ICD-10-CM | POA: Diagnosis not present

## 2020-09-12 DIAGNOSIS — I700 Atherosclerosis of aorta: Secondary | ICD-10-CM | POA: Diagnosis not present

## 2020-09-12 DIAGNOSIS — E785 Hyperlipidemia, unspecified: Secondary | ICD-10-CM | POA: Diagnosis not present

## 2020-09-12 DIAGNOSIS — D692 Other nonthrombocytopenic purpura: Secondary | ICD-10-CM | POA: Diagnosis not present

## 2020-09-12 DIAGNOSIS — N401 Enlarged prostate with lower urinary tract symptoms: Secondary | ICD-10-CM | POA: Diagnosis not present

## 2020-09-12 DIAGNOSIS — Z23 Encounter for immunization: Secondary | ICD-10-CM | POA: Diagnosis not present

## 2020-09-12 DIAGNOSIS — M8580 Other specified disorders of bone density and structure, unspecified site: Secondary | ICD-10-CM | POA: Diagnosis not present

## 2020-09-12 DIAGNOSIS — N393 Stress incontinence (female) (male): Secondary | ICD-10-CM | POA: Diagnosis not present

## 2020-09-12 DIAGNOSIS — R413 Other amnesia: Secondary | ICD-10-CM | POA: Diagnosis not present

## 2020-09-12 DIAGNOSIS — Z79899 Other long term (current) drug therapy: Secondary | ICD-10-CM | POA: Diagnosis not present

## 2020-09-12 DIAGNOSIS — M25552 Pain in left hip: Secondary | ICD-10-CM | POA: Diagnosis not present

## 2021-01-08 ENCOUNTER — Other Ambulatory Visit: Payer: PPO

## 2021-01-08 DIAGNOSIS — R739 Hyperglycemia, unspecified: Secondary | ICD-10-CM | POA: Diagnosis not present

## 2021-01-08 DIAGNOSIS — R413 Other amnesia: Secondary | ICD-10-CM | POA: Diagnosis not present

## 2021-01-08 DIAGNOSIS — M8580 Other specified disorders of bone density and structure, unspecified site: Secondary | ICD-10-CM | POA: Diagnosis not present

## 2021-01-08 DIAGNOSIS — E559 Vitamin D deficiency, unspecified: Secondary | ICD-10-CM | POA: Diagnosis not present

## 2021-01-08 DIAGNOSIS — Z8546 Personal history of malignant neoplasm of prostate: Secondary | ICD-10-CM | POA: Diagnosis not present

## 2021-01-08 DIAGNOSIS — N401 Enlarged prostate with lower urinary tract symptoms: Secondary | ICD-10-CM | POA: Diagnosis not present

## 2021-01-08 DIAGNOSIS — J449 Chronic obstructive pulmonary disease, unspecified: Secondary | ICD-10-CM | POA: Diagnosis not present

## 2021-01-08 DIAGNOSIS — I700 Atherosclerosis of aorta: Secondary | ICD-10-CM | POA: Diagnosis not present

## 2021-01-08 DIAGNOSIS — H3530 Unspecified macular degeneration: Secondary | ICD-10-CM | POA: Diagnosis not present

## 2021-01-08 DIAGNOSIS — D692 Other nonthrombocytopenic purpura: Secondary | ICD-10-CM | POA: Diagnosis not present

## 2021-01-08 DIAGNOSIS — E785 Hyperlipidemia, unspecified: Secondary | ICD-10-CM | POA: Diagnosis not present

## 2021-01-08 DIAGNOSIS — M25552 Pain in left hip: Secondary | ICD-10-CM | POA: Diagnosis not present

## 2021-01-12 ENCOUNTER — Ambulatory Visit (INDEPENDENT_AMBULATORY_CARE_PROVIDER_SITE_OTHER)
Admission: RE | Admit: 2021-01-12 | Discharge: 2021-01-12 | Disposition: A | Discharge status: Home / Self Care | Payer: PPO | Source: Ambulatory Visit | Attending: Adult Health | Admitting: Adult Health

## 2021-01-12 ENCOUNTER — Other Ambulatory Visit: Payer: Self-pay

## 2021-01-12 DIAGNOSIS — R911 Solitary pulmonary nodule: Secondary | ICD-10-CM

## 2021-01-12 DIAGNOSIS — R918 Other nonspecific abnormal finding of lung field: Secondary | ICD-10-CM | POA: Diagnosis not present

## 2021-01-12 DIAGNOSIS — I700 Atherosclerosis of aorta: Secondary | ICD-10-CM | POA: Diagnosis not present

## 2021-02-02 ENCOUNTER — Encounter: Payer: Self-pay | Admitting: Pulmonary Disease

## 2021-02-02 ENCOUNTER — Ambulatory Visit: Payer: PPO | Admitting: Pulmonary Disease

## 2021-02-02 ENCOUNTER — Other Ambulatory Visit: Payer: Self-pay

## 2021-02-02 VITALS — BP 136/82 | HR 67 | Temp 98.0°F | Ht 70.0 in | Wt 158.4 lb

## 2021-02-02 DIAGNOSIS — R911 Solitary pulmonary nodule: Secondary | ICD-10-CM | POA: Diagnosis not present

## 2021-02-02 DIAGNOSIS — Z7722 Contact with and (suspected) exposure to environmental tobacco smoke (acute) (chronic): Secondary | ICD-10-CM | POA: Diagnosis not present

## 2021-02-02 NOTE — Progress Notes (Signed)
Synopsis: Referred in December 2021 for lung nodule by Shon Baton, MD  Subjective:   PATIENT ID: Dennis Reynolds GENDER: male DOB: [birth year], MRN: [id number]  Chief Complaint  Patient presents with   Follow-up    Patient has no complaints.     This is an 88 year old gentleman with a past medical history of BPH, COPD, GERD, hyperlipidemia, prostate cancer, radiation proctitis.  Recent left extracorporeal shockwave lithotripsy for ureteral stone.  Patient had a CT scan of the abdomen and pelvis , 12/10/2019 which revealed a new 9 mm noncalcified pulmonary nodule within the left lower lobe of the lung.  Patient was referred here for evaluation of the lung nodule and next steps.  12/31/2019 OV: Patient here today for evaluation of incidentally found lung nodule.  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  He was around a lot of secondhand smoke cigarettes exposure but he has never been a smoker himself.  Additionally had exposures with being a Agricultural consultant.  In the office today with his wife of 60+ years.  Patient denies weight loss fevers chills night sweats.  OV 02/02/2021: Here today for follow-up after CT scan of the chest.Patient had nodule CT follow-up completed in 01/12/2021 which revealed stability of a 9 mm left lower for pulmonary nodule.  Will need 50-month CT follow-up.  From respiratory standpoint patient doing okay.  Reviewed his CT scan results with him today in the office.  We compared them to his previous scans in March.  Nodule has been stable in size measuring just under 9 mm in largest cross-section.   Past Medical History:  Diagnosis Date   Arthritis    BPH (benign prostatic hyperplasia)    COPD (chronic obstructive pulmonary disease) ([place])    Diverticulosis    ED (erectile dysfunction)    ED (erectile dysfunction)    Frequency of urination    GERD (gastroesophageal reflux disease)    Hemorrhoids    History of chronic otitis media    History of  confusion neruologist--- dr Tomi Likens   episode transient confusion 12/ 2020   History of duodenal ulcer 2013   due to H.Pylori, treated   History of esophageal stricture    s/p dilatation 2013;  2018   History of external beam radiation therapy    prostate   History of kidney stones    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 Charon Smedberg--  repeat chest ct 01-08-2020 , just monitor   Osteopenia    Prostate cancer ([place]) 2003   Sinus bradycardia    Vitamin D deficiency    Wears partial dentures    upper     Family History  Problem Relation Age of Onset   Colon cancer Sister    Diabetes Sister    Heart disease Mother    Prostate cancer Father    Parkinsonism Father      Past Surgical History:  Procedure Laterality Date   COLONOSCOPY  last one 04-23-2011  dr Carlean Purl   CYSTOSCOPY/URETEROSCOPY/HOLMIUM LASER/STENT PLACEMENT Left 01/22/2020   Procedure: CYSTOSCOPY LEFT URETEROSCOPY/HOLMIUM LASER/STENT PLACEMENT LEFT RETROGRADE STONE BASKET EXTRATION;  Surgeon: Irine Seal, MD;  Location: [facility];  Service: Urology;  Laterality: Left;   ESOPHAGOGASTRODUODENOSCOPY  last one 11-02-2016   EXTRACORPOREAL SHOCK WAVE LITHOTRIPSY Left 12/20/2019   Procedure: LEFT EXTRACORPOREAL SHOCK WAVE LITHOTRIPSY (ESWL);  Surgeon: Irine Seal, MD;  Location: [facility];  Service: Urology;  Laterality: Left;   INGUINAL HERNIA REPAIR Right 11-28-2001  @[facility]    INNER EAR SURGERY     right   PROSTATECTOMY  06-27-2001  @[facility]    w/ bilateral pelvic LND   RETINAL DETACHMENT SURGERY Left x2   TONSILLECTOMY AND ADENOIDECTOMY      Social History   Socioeconomic History   Marital status: Married    Spouse name: Therapist, music   Number of children: 2   Years of  education: Not on file   Highest education level: Associate degree: occupational, Hotel manager, or vocational program  Occupational History   Occupation: retired Airline pilot  Tobacco Use   Smoking status: Never   Smokeless tobacco: Never  Vaping Use   Vaping Use: Never used  Substance and Sexual Activity   Alcohol use: No   Drug use: No   Sexual activity: Not on file    Comment: vasectomy  Other Topics Concern   Not on file  Social History Narrative   Lives with wife, Right handed   Two story home   occ caffeine   Social Determinants of Health   Financial Resource Strain: Not on file  Food Insecurity: Not on file  Transportation Needs: Not on file  Physical Activity: Not on file  Stress: Not on file  Social Connections: Not on file  Intimate Partner Violence: Not on file     No Known Allergies   Outpatient Medications Prior to Visit  Medication Sig Dispense Refill   memantine (NAMENDA) 5 MG tablet Take 5 mg by mouth at bedtime.     alendronate (FOSAMAX) 70 MG tablet Take 70 mg by mouth once a week. Take with a full glass of water on an empty stomach. (Patient not taking: Reported on 04/03/2020)     aspirin EC 325 MG tablet Take 325 mg by mouth every 4 (four) hours as needed for mild pain. (Patient not taking: Reported on 04/03/2020)     No facility-administered medications prior to visit.    Review of Systems  Constitutional:  Negative for chills, fever, malaise/fatigue and weight loss.  HENT:  Negative for hearing loss, sore throat and tinnitus.   Eyes:  Negative for blurred vision and double vision.  Respiratory:  Negative for cough, hemoptysis, sputum production, shortness of breath, wheezing and stridor.   Cardiovascular:  Negative for chest pain, palpitations, orthopnea, leg swelling and PND.  Gastrointestinal:  Negative for abdominal pain, constipation, diarrhea, heartburn, nausea and vomiting.  Genitourinary:  Negative for dysuria, hematuria and urgency.   Musculoskeletal:  Negative for joint pain and myalgias.  Skin:  Negative for itching and rash.  Neurological:  Negative for dizziness, tingling, weakness and headaches.  Endo/Heme/Allergies:  Negative for environmental allergies. Does not bruise/bleed easily.  Psychiatric/Behavioral:  Negative for depression. The patient is not nervous/anxious and does not have insomnia.   All other systems reviewed and are negative.   Objective:  Physical Exam Vitals reviewed.  Constitutional:      General: He is not in acute distress.    Appearance: He is well-developed.  HENT:     Head: Normocephalic and atraumatic.  Eyes:     General: No scleral icterus.    Conjunctiva/sclera: Conjunctivae normal.     Pupils: Pupils are equal, round, and reactive to light.  Neck:     Vascular: No JVD.     Trachea: No tracheal deviation.  Cardiovascular:     Rate and Rhythm: Normal rate and regular rhythm.     Heart sounds: Normal heart sounds. No murmur heard. Pulmonary:     Effort: Pulmonary effort is normal. No tachypnea, accessory muscle usage or respiratory distress.     Breath sounds: No stridor. No wheezing, rhonchi or rales.  Abdominal:     General: There is no distension.     Palpations: Abdomen is soft.     Tenderness: There is no abdominal tenderness.  Musculoskeletal:        General: No tenderness.     Cervical back: Neck supple.  Lymphadenopathy:     Cervical: No cervical adenopathy.  Skin:    General: Skin is warm and dry.     Capillary Refill: Capillary refill takes less than 2 seconds.     Findings: No rash.  Neurological:     Mental Status: He is alert and oriented to person, place, and time.  Psychiatric:        Behavior: Behavior normal.     Vitals:   02/02/21 0937  BP: 136/82  Pulse: 67  Temp: 98 F (36.7 C)  TempSrc: Oral  SpO2: 96%  Weight: 158 lb 6.4 oz (71.8 kg)  Height: 5\' 10"  (1.778 m)   96% on RA BMI Readings from Last 3 Encounters:  02/02/21 22.73 kg/m   04/03/20 23.72 kg/m  01/22/20 23.72 kg/m   Wt Readings from Last 3 Encounters:  02/02/21 158 lb 6.4 oz (71.8 kg)  04/03/20 156 lb (70.8 kg)  01/22/20 156 lb (70.8 kg)     CBC    Component Value Date/Time   WBC 9.8 12/10/2019 0819   RBC 4.44 12/10/2019 0819   HGB 13.3 12/10/2019 0819   HCT 41.4 12/10/2019 0819   PLT 249 12/10/2019 0819   MCV 93.2 12/10/2019 0819   MCH 30.0 12/10/2019 0819   MCHC 32.1 12/10/2019 0819   RDW 13.7 12/10/2019 0819   LYMPHSABS 1.0 12/10/2019 0819   MONOABS 0.6 12/10/2019 0819   EOSABS 0.1 12/10/2019 0819   BASOSABS 0.0 12/10/2019 0819    Chest Imaging: 12/10/2019 CT abdomen and pelvis: There is a 9 mm noncalcified pulmonary nodule that is on the lateral aspect of the left lung subpleural in nature.  Due to its size would recommend close follow-up.  The patient's images have been independently reviewed by me.    01/07/2021: CT chest: Stable 9 mm noncalcified pulmonary nodule in the lateral aspect of the left lung. The patient's images have been independently reviewed by me.     Pulmonary Functions Testing Results: No flowsheet data found.  FeNO:   Pathology:   Echocardiogram:   Heart Catheterization:     Assessment & Plan:     ICD-10-CM   1. Lung nodule  R91.1 CT Super D Chest Wo Contrast    2. Nodule of lower lobe of left lung  R91.1     3. Second hand smoke exposure  Z77.22        Discussion:  This is an 88 year old gentleman, incidentally found left lower lobe pulmonary nodule has significant secondhand smoke exposure but a lifelong non-smoker.  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 The nodule has been stable since January 2022. With still document his 2-year stability based on  the repeat CT in January 2024. Patient is agreeable with this plan. Return to clinic in 1 year after repeat noncontrasted CT scan.    Current  Outpatient Medications:    memantine (NAMENDA) 5 MG tablet, Take 5 mg by mouth at bedtime., Disp: , Rfl:    alendronate (FOSAMAX) 70 MG tablet, Take 70 mg by mouth once a week. Take with a full glass of water on an empty stomach. (Patient not taking: Reported on 04/03/2020), Disp: , Rfl:    aspirin EC 325 MG tablet, Take 325 mg by mouth every 4 (four) hours as needed for mild pain. (Patient not taking: Reported on 04/03/2020), Disp: , Rfl:    Garner Nash, DO [facility] 02/02/2021 9:42 AM

## 2021-02-02 NOTE — Patient Instructions (Addendum)
Thank you for visiting Dr. Valeta Harms at [facility]. Today we recommend the following:  Orders Placed This Encounter  Procedures   CT Super D Chest Wo Contrast   Please see [place] in 1 year after CT complete.   Return in about 1 year (around 02/02/2022) for with Eric Form, NP, or Dr. Valeta Harms.    Please do your part to reduce the spread of COVID-19.

## 2021-02-26 IMAGING — CT CT CHEST W/O CM
2 of 4 series · 15 of 36 positions shown, 18 images · non-contrast
Comparison: CT dated December 10, 2019.

CLINICAL DATA: Lung nodule follow-up.

EXAM:
CT CHEST WITHOUT CONTRAST
TECHNIQUE: Multidetector CT imaging of the chest was performed following the
standard protocol without IV contrast.

[Series 3: thorax · axial · 0.72mm/px · z∈[-254,+26]mm · 12 of 166 slices shown, 15 images]
[im 13/166  mediastinal]
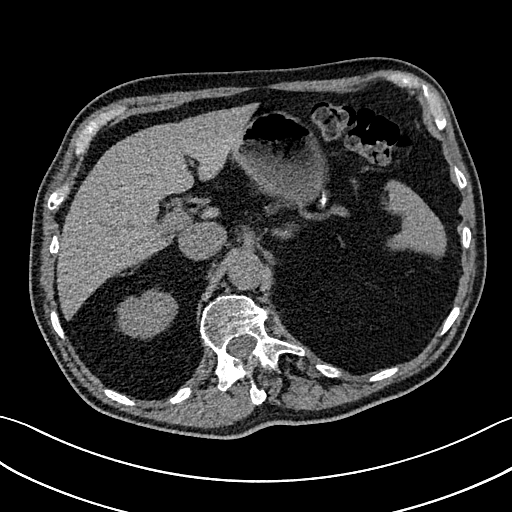
[im 13/166  lung]
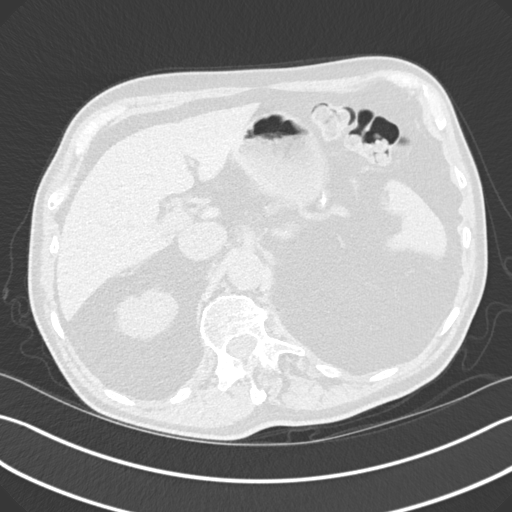
[im 26/166  lung]
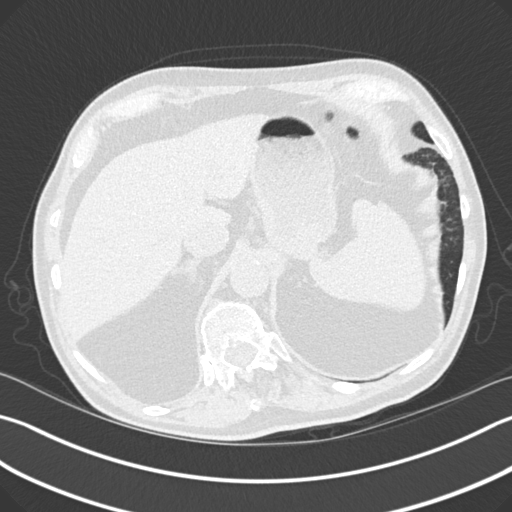
[im 39/166  lung]
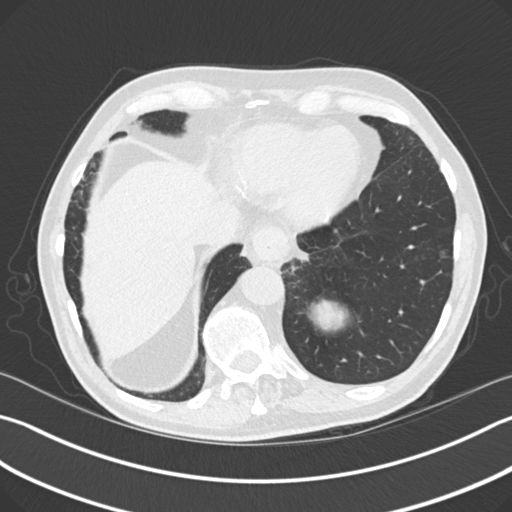
[im 51/166  lung]
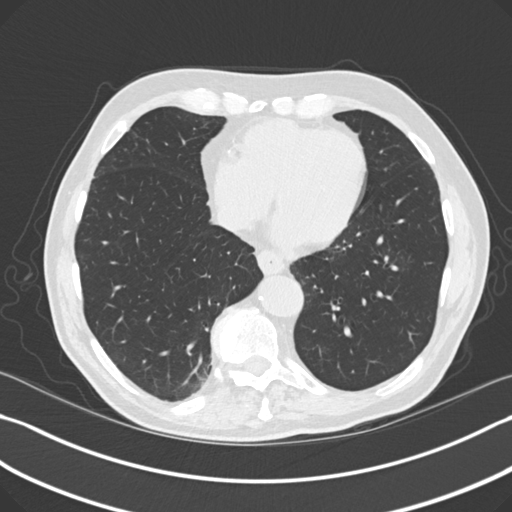
[im 64/166  mediastinal]
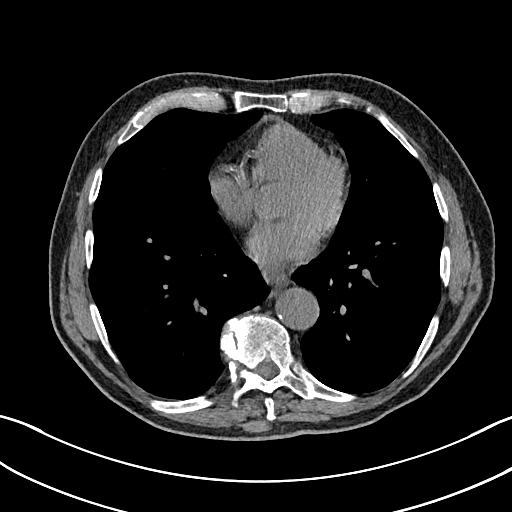
[im 64/166  lung]
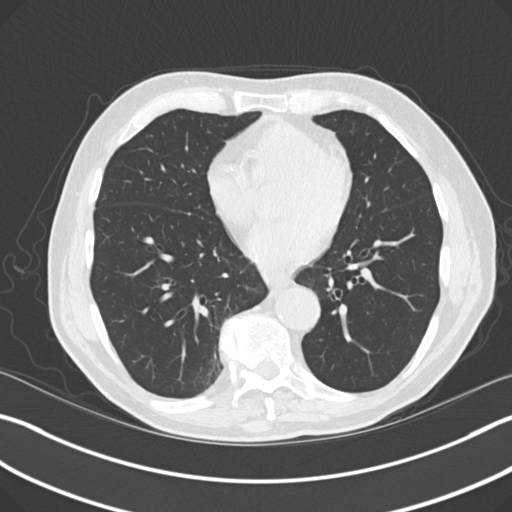
[im 77/166  lung]
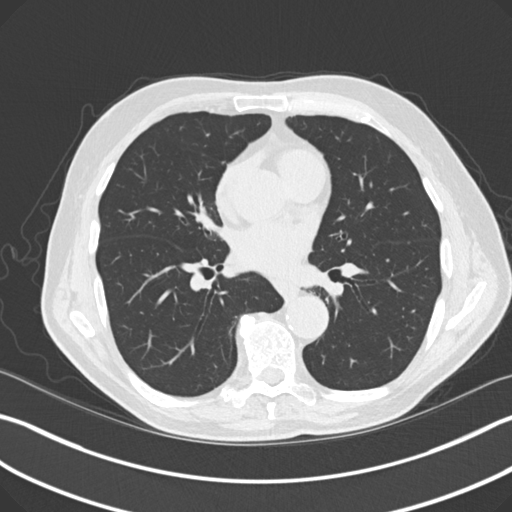
[im 89/166  lung]
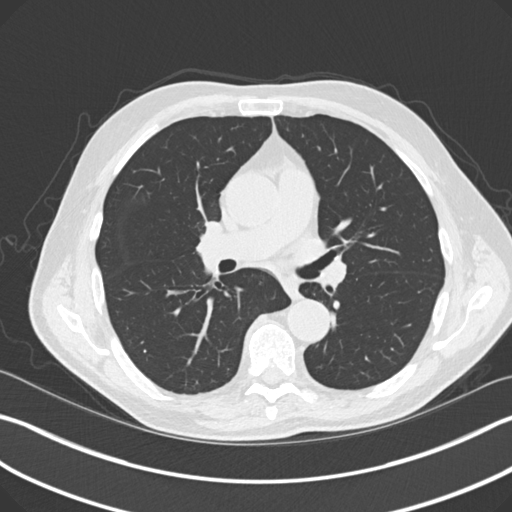
[im 102/166  lung]
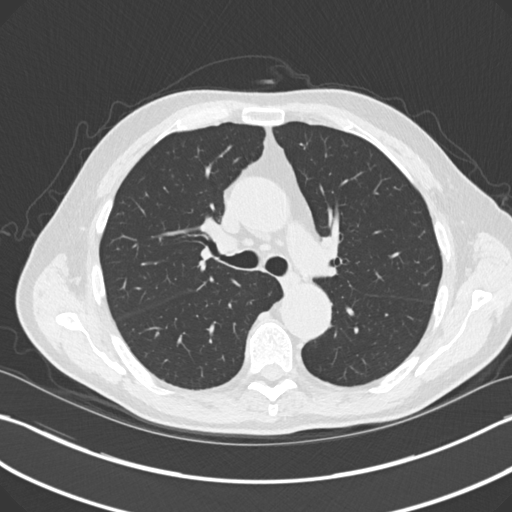
[im 115/166  mediastinal]
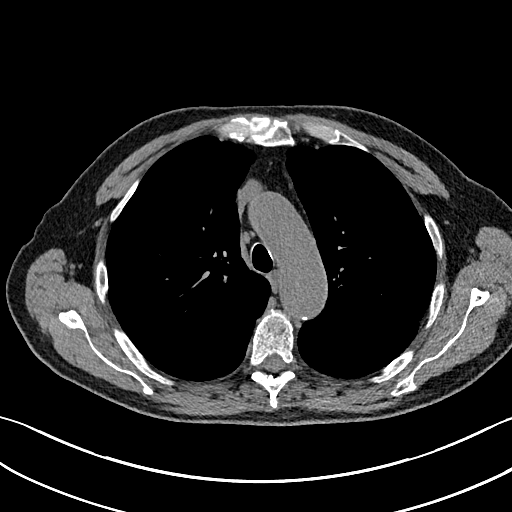
[im 115/166  lung]
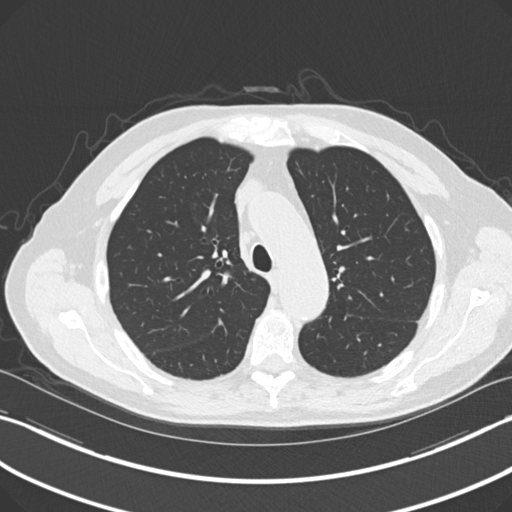
[im 127/166  lung]
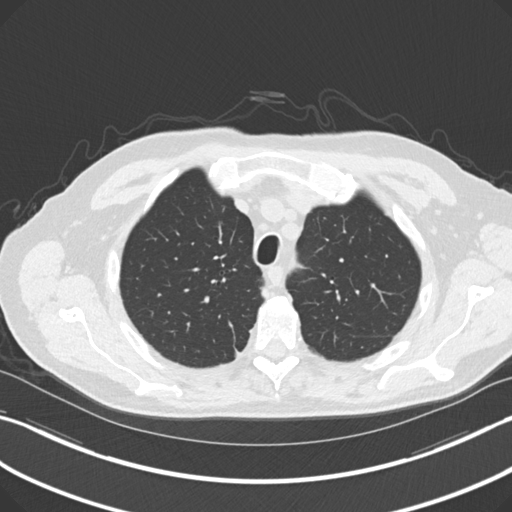
[im 140/166  lung]
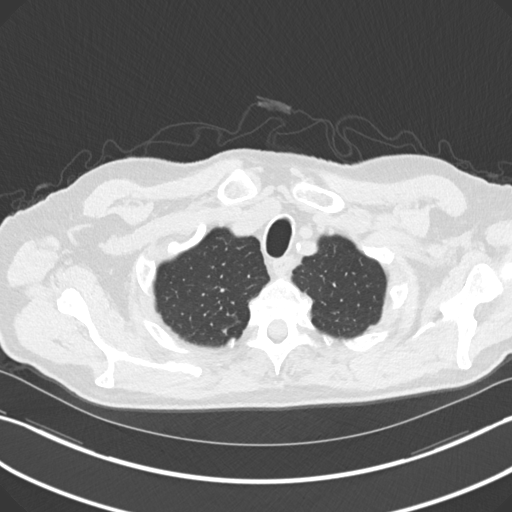
[im 153/166  lung]
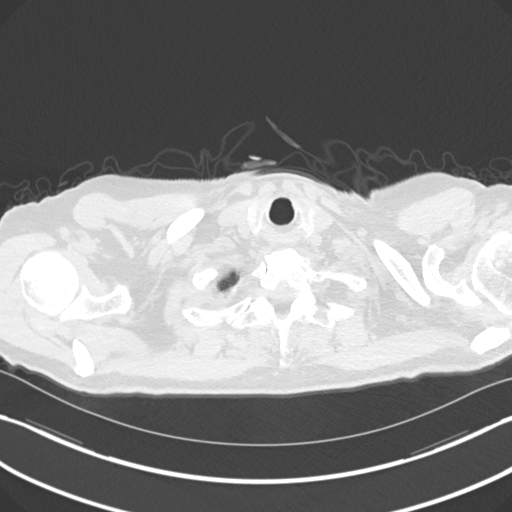

[Series 6: coronal · coronal · 0.64mm/px · 3 of 129 slices shown]
[im 26/129  lung]
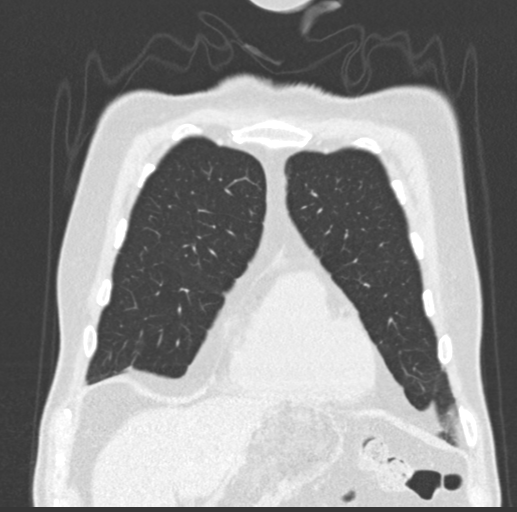
[im 52/129  lung]
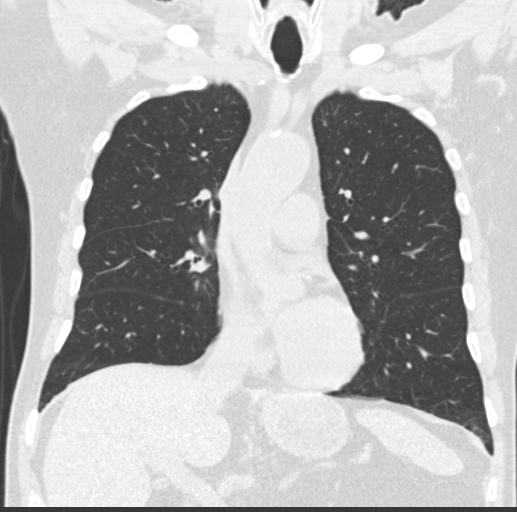
[im 77/129  lung]
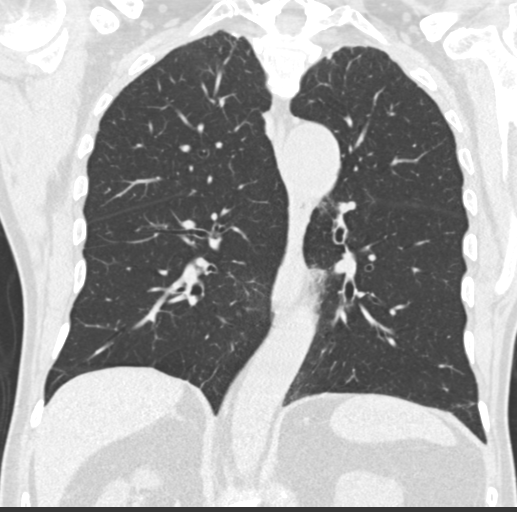

[15 of 36 positions shown; findings below may reference images not displayed]

FINDINGS: Cardiovascular: Coronary artery calcifications are noted. The heart
size is normal. Atherosclerotic changes are noted of the thoracic
aorta without evidence for an aneurysm. There is no significant
pericardial effusion.

Mediastinum/Nodes:

-- No mediastinal lymphadenopathy.

-- No hilar lymphadenopathy.

-- No axillary lymphadenopathy.

-- No supraclavicular lymphadenopathy.

-- Normal thyroid gland where visualized.

-there is mild diffuse wall thickening of the distal esophagus.

Lungs/Pleura: Multiple calcified granulomas are noted bilaterally.
There is no pneumothorax. No large pleural effusion. Again noted is
a pulmonary nodule in the left lower lobe measuring approximately
9-10 mm. The trachea is unremarkable.

Upper Abdomen: There is no acute abnormality in the upper abdomen.

Musculoskeletal: No chest wall abnormality. No bony spinal canal
stenosis.
IMPRESSION: 1. Persistent 9 mm pulmonary nodule in the left lower lobe.
Outpatient pulmonary medicine follow-up is recommended for
management.
2. There are bilateral calcified granulomas.
3. Mild thickening of the distal esophagus which can be seen in
patients with esophagitis.

Aortic Atherosclerosis ([id number]).

## 2021-07-22 DIAGNOSIS — H348312 Tributary (branch) retinal vein occlusion, right eye, stable: Secondary | ICD-10-CM | POA: Diagnosis not present

## 2021-07-22 DIAGNOSIS — H31092 Other chorioretinal scars, left eye: Secondary | ICD-10-CM | POA: Diagnosis not present

## 2021-07-22 DIAGNOSIS — H353132 Nonexudative age-related macular degeneration, bilateral, intermediate dry stage: Secondary | ICD-10-CM | POA: Diagnosis not present

## 2021-07-22 DIAGNOSIS — H35373 Puckering of macula, bilateral: Secondary | ICD-10-CM | POA: Diagnosis not present

## 2021-07-22 DIAGNOSIS — Z961 Presence of intraocular lens: Secondary | ICD-10-CM | POA: Diagnosis not present

## 2021-09-21 DIAGNOSIS — R739 Hyperglycemia, unspecified: Secondary | ICD-10-CM | POA: Diagnosis not present

## 2021-09-21 DIAGNOSIS — I700 Atherosclerosis of aorta: Secondary | ICD-10-CM | POA: Diagnosis not present

## 2021-09-21 DIAGNOSIS — R7989 Other specified abnormal findings of blood chemistry: Secondary | ICD-10-CM | POA: Diagnosis not present

## 2021-09-21 DIAGNOSIS — E559 Vitamin D deficiency, unspecified: Secondary | ICD-10-CM | POA: Diagnosis not present

## 2021-09-21 DIAGNOSIS — Z125 Encounter for screening for malignant neoplasm of prostate: Secondary | ICD-10-CM | POA: Diagnosis not present

## 2021-09-21 DIAGNOSIS — E785 Hyperlipidemia, unspecified: Secondary | ICD-10-CM | POA: Diagnosis not present

## 2021-09-29 DIAGNOSIS — M8580 Other specified disorders of bone density and structure, unspecified site: Secondary | ICD-10-CM | POA: Diagnosis not present

## 2021-09-29 DIAGNOSIS — E559 Vitamin D deficiency, unspecified: Secondary | ICD-10-CM | POA: Diagnosis not present

## 2021-09-29 DIAGNOSIS — N401 Enlarged prostate with lower urinary tract symptoms: Secondary | ICD-10-CM | POA: Diagnosis not present

## 2021-09-29 DIAGNOSIS — M25561 Pain in right knee: Secondary | ICD-10-CM | POA: Diagnosis not present

## 2021-09-29 DIAGNOSIS — G629 Polyneuropathy, unspecified: Secondary | ICD-10-CM | POA: Diagnosis not present

## 2021-09-29 DIAGNOSIS — Z8546 Personal history of malignant neoplasm of prostate: Secondary | ICD-10-CM | POA: Diagnosis not present

## 2021-09-29 DIAGNOSIS — I10 Essential (primary) hypertension: Secondary | ICD-10-CM | POA: Diagnosis not present

## 2021-09-29 DIAGNOSIS — D692 Other nonthrombocytopenic purpura: Secondary | ICD-10-CM | POA: Diagnosis not present

## 2021-09-29 DIAGNOSIS — R82998 Other abnormal findings in urine: Secondary | ICD-10-CM | POA: Diagnosis not present

## 2021-09-29 DIAGNOSIS — R413 Other amnesia: Secondary | ICD-10-CM | POA: Diagnosis not present

## 2021-09-29 DIAGNOSIS — M25552 Pain in left hip: Secondary | ICD-10-CM | POA: Diagnosis not present

## 2021-09-29 DIAGNOSIS — E785 Hyperlipidemia, unspecified: Secondary | ICD-10-CM | POA: Diagnosis not present

## 2021-09-29 DIAGNOSIS — Z0000 Encounter for general adult medical examination without abnormal findings: Secondary | ICD-10-CM | POA: Diagnosis not present

## 2021-09-29 DIAGNOSIS — I700 Atherosclerosis of aorta: Secondary | ICD-10-CM | POA: Diagnosis not present

## 2021-11-23 DIAGNOSIS — F4321 Adjustment disorder with depressed mood: Secondary | ICD-10-CM | POA: Diagnosis not present

## 2022-01-12 DIAGNOSIS — R41841 Cognitive communication deficit: Secondary | ICD-10-CM | POA: Diagnosis not present

## 2022-01-14 ENCOUNTER — Ambulatory Visit
Admission: RE | Admit: 2022-01-14 | Discharge: 2022-01-14 | Disposition: A | Discharge status: Home / Self Care | Payer: PPO | Source: Ambulatory Visit | Attending: Pulmonary Disease | Admitting: Pulmonary Disease

## 2022-01-14 DIAGNOSIS — I700 Atherosclerosis of aorta: Secondary | ICD-10-CM | POA: Diagnosis not present

## 2022-01-14 DIAGNOSIS — R911 Solitary pulmonary nodule: Secondary | ICD-10-CM

## 2022-01-14 DIAGNOSIS — R918 Other nonspecific abnormal finding of lung field: Secondary | ICD-10-CM | POA: Diagnosis not present

## 2022-01-21 ENCOUNTER — Ambulatory Visit: Payer: PPO | Admitting: Pulmonary Disease

## 2022-01-21 ENCOUNTER — Encounter: Payer: Self-pay | Admitting: Pulmonary Disease

## 2022-01-21 VITALS — BP 110/68 | HR 71 | Ht 70.0 in | Wt 158.8 lb

## 2022-01-21 DIAGNOSIS — R911 Solitary pulmonary nodule: Secondary | ICD-10-CM

## 2022-01-21 DIAGNOSIS — Z7722 Contact with and (suspected) exposure to environmental tobacco smoke (acute) (chronic): Secondary | ICD-10-CM

## 2022-01-21 NOTE — Patient Instructions (Signed)
Thank you for visiting Dr. Bodin Gorka at [facility]. Today we recommend the following:  Return if symptoms worsen or fail to improve.    Please do your part to reduce the spread of COVID-19.  

## 2022-01-21 NOTE — Progress Notes (Signed)
Synopsis: Referred in December 2021 for lung nodule by Shon Baton, MD  Subjective:   PATIENT ID: Dennis Reynolds GENDER: male DOB: [birth year], MRN: [id number]  Chief Complaint  Patient presents with   Follow-up    F/up    This is an 89 year old gentleman with a past medical history of BPH, COPD, GERD, hyperlipidemia, prostate cancer, radiation proctitis.  Recent left extracorporeal shockwave lithotripsy for ureteral stone.  Patient had a CT scan of the abdomen and pelvis , 12/10/2019 which revealed a new 9 mm noncalcified pulmonary nodule within the left lower lobe of the lung.  Patient was referred here for evaluation of the lung nodule and next steps.  12/31/2019 OV: Patient here today for evaluation of incidentally found lung nodule.  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  He was around a lot of secondhand smoke cigarettes exposure but he has never been a smoker himself.  Additionally had exposures with being a Agricultural consultant.  In the office today with his wife of 60+ years.  Patient denies weight loss fevers chills night sweats.  OV 02/02/2021: Here today for follow-up after CT scan of the chest.Patient had nodule CT follow-up completed in 01/12/2021 which revealed stability of a 9 mm left lower for pulmonary nodule.  Will need 31-monthCT follow-up.  From respiratory standpoint patient doing okay.  Reviewed his CT scan results with him today in the office.  We compared them to his previous scans in March.  Nodule has been stable in size measuring just under 9 mm in largest cross-section.  OV 01/21/2022: Patient here today for follow-up after CT scan of the chest.  It has been a year since his last scan.  Shows stability 9 mm left lower lobe pulmonary nodule.  There is been no appreciable change in the nodule posterior presumed benign at this point.  No additional follow-up needed.  Reviewed CT imaging today with patient as well as patient's son which was present  today.    Past Medical History:  Diagnosis Date   Arthritis    BPH (benign prostatic hyperplasia)    [place]    Diverticulosis    ED (erectile dysfunction)    ED (erectile dysfunction)    Frequency of urination    GERD (gastroesophageal reflux disease)    Hemorrhoids    History of chronic otitis media    History of confusion neruologist--- dr jTomi Likens  episode transient confusion 12/ 2020   History of duodenal ulcer 2013   due to H.Pylori, treated   History of esophageal stricture    s/p dilatation 2013;  2018   History of external beam radiation therapy    prostate   History of kidney stones    History of proctitis 2013   due to radiation   History of prostate cancer urologist-- dr wJeffie Pollock  06-27-2001  s/p  radical prostatectomy   Hyperlipidemia    Left ureteral stone    Neuropathy    LLE   Nodule of lower lobe of left lung 12/2019   pulmonology--- dr b. Khelani Kops--  repeat chest ct 01-08-2020 , just monitor   Osteopenia    [place]   Sinus bradycardia    Vitamin D deficiency    Wears partial dentures    upper     Family History  Problem Relation Age of Onset   Colon cancer Sister    Diabetes Sister    Heart disease  Mother    Prostate cancer Father    Parkinsonism Father      Past Surgical History:  Procedure Laterality Date   COLONOSCOPY  last one 04-23-2011  dr Carlean Purl   CYSTOSCOPY/URETEROSCOPY/HOLMIUM LASER/STENT PLACEMENT Left 01/22/2020   Procedure: CYSTOSCOPY LEFT URETEROSCOPY/HOLMIUM LASER/STENT PLACEMENT LEFT RETROGRADE STONE BASKET EXTRATION;  Surgeon: Irine Seal, MD;  Location: [facility];  Service: Urology;  Laterality: Left;   ESOPHAGOGASTRODUODENOSCOPY  last one 11-02-2016   EXTRACORPOREAL SHOCK WAVE LITHOTRIPSY Left 12/20/2019   Procedure: LEFT EXTRACORPOREAL SHOCK WAVE LITHOTRIPSY (ESWL);  Surgeon: Irine Seal, MD;  Location[facility];  Service: Urology;   Laterality: Left;   INGUINAL HERNIA REPAIR Right 11-28-2001  '@[facility]'$    INNER EAR SURGERY     right   PROSTATECTOMY  06-27-2001  '@[facility]'$    w/ bilateral pelvic LND   RETINAL DETACHMENT SURGERY Left x2   TONSILLECTOMY AND ADENOIDECTOMY      Social History   Socioeconomic History   Marital status: Married    Spouse name: Therapist, music   Number of children: 2   Years of education: Not on file   Highest education level: Associate degree: occupational, Hotel manager, or vocational program  Occupational History   Occupation: retired Airline pilot  Tobacco Use   Smoking status: Never   Smokeless tobacco: Never  Vaping Use   Vaping Use: Never used  Substance and Sexual Activity   Alcohol use: No   Drug use: No   Sexual activity: Not on file    Comment: vasectomy  Other Topics Concern   Not on file  Social History Narrative   Lives with wife, Right handed   Two story home   occ caffeine   Social Determinants of Health   Financial Resource Strain: Not on file  Food Insecurity: Not on file  Transportation Needs: Not on file  Physical Activity: Not on file  Stress: Not on file  Social Connections: Not on file  Intimate Partner Violence: Not on file     No Known Allergies   Outpatient Medications Prior to Visit  Medication Sig Dispense Refill   alendronate (FOSAMAX) 70 MG tablet Take 70 mg by mouth once a week. Take with a full glass of water on an empty stomach.     memantine (NAMENDA) 10 MG tablet Take 10 mg by mouth at bedtime.     aspirin EC 325 MG tablet Take 325 mg by mouth every 4 (four) hours as needed for mild pain. (Patient not taking: Reported on 04/03/2020)     memantine (NAMENDA) 5 MG tablet Take 5 mg by mouth at bedtime.     No facility-administered medications prior to visit.    Review of Systems  Constitutional:  Negative for chills, fever, malaise/fatigue and weight loss.  HENT:  Negative for hearing loss, sore throat and tinnitus.   Eyes:  Negative for blurred vision and  double vision.  Respiratory:  Negative for cough, hemoptysis, sputum production, shortness of breath, wheezing and stridor.   Cardiovascular:  Negative for chest pain, palpitations, orthopnea, leg swelling and PND.  Gastrointestinal:  Negative for abdominal pain, constipation, diarrhea, heartburn, nausea and vomiting.  Genitourinary:  Negative for dysuria, hematuria and urgency.  Musculoskeletal:  Negative for joint pain and myalgias.  Skin:  Negative for itching and rash.  Neurological:  Negative for dizziness, tingling, weakness and headaches.  Endo/Heme/Allergies:  Negative for environmental allergies. Does not bruise/bleed easily.  Psychiatric/Behavioral:  Negative for depression. The patient is not nervous/anxious and does not  have insomnia.   All other systems reviewed and are negative.    Objective:  Physical Exam Vitals reviewed.  Constitutional:      General: He is not in acute distress.    Appearance: He is well-developed.  HENT:     Head: Normocephalic and atraumatic.  Eyes:     General: No scleral icterus.    Conjunctiva/sclera: Conjunctivae normal.     Pupils: Pupils are equal, round, and reactive to light.  Neck:     Vascular: No JVD.     Trachea: No tracheal deviation.  Cardiovascular:     Rate and Rhythm: Normal rate and regular rhythm.     Heart sounds: No murmur heard. Pulmonary:     Effort: Pulmonary effort is normal. No tachypnea, accessory muscle usage or respiratory distress.     Breath sounds: No stridor. No wheezing, rhonchi or rales.  Abdominal:     General: There is no distension.     Palpations: Abdomen is soft.     Tenderness: There is no abdominal tenderness.  Musculoskeletal:        General: No tenderness.     Cervical back: Neck supple.  Lymphadenopathy:     Cervical: No cervical adenopathy.  Skin:    General: Skin is warm and dry.     Capillary Refill: Capillary refill takes less than 2 seconds.     Findings: No rash.  Neurological:      Mental Status: He is alert and oriented to person, place, and time.  Psychiatric:        Behavior: Behavior normal.      Vitals:   01/21/22 1508  BP: 110/68  Pulse: 71  SpO2: 98%  Weight: 158 lb 12.8 oz (72 kg)  Height: '5\' 10"'$  (1.778 m)   98% on RA BMI Readings from Last 3 Encounters:  01/21/22 22.79 kg/m  02/02/21 22.73 kg/m  04/03/20 23.72 kg/m   Wt Readings from Last 3 Encounters:  01/21/22 158 lb 12.8 oz (72 kg)  02/02/21 158 lb 6.4 oz (71.8 kg)  04/03/20 156 lb (70.8 kg)     CBC    Component Value Date/Time   WBC 9.8 12/10/2019 0819   RBC 4.44 12/10/2019 0819   HGB 13.3 12/10/2019 0819   HCT 41.4 12/10/2019 0819   PLT 249 12/10/2019 0819   MCV 93.2 12/10/2019 0819   MCH 30.0 12/10/2019 0819   MCHC 32.1 12/10/2019 0819   RDW 13.7 12/10/2019 0819   LYMPHSABS 1.0 12/10/2019 0819   MONOABS 0.6 12/10/2019 0819   EOSABS 0.1 12/10/2019 0819   BASOSABS 0.0 12/10/2019 0819    Chest Imaging: 12/10/2019 CT abdomen and pelvis: There is a 9 mm noncalcified pulmonary nodule that is on the lateral aspect of the left lung subpleural in nature.  Due to its size would recommend close follow-up.  The patient's images have been independently reviewed by me.    01/07/2021: CT chest: Stable 9 mm noncalcified pulmonary nodule in the lateral aspect of the left lung. The patient's images have been independently reviewed by me.    01/15/2022: Super D CT chest: 9 mm noncalcified pulmonary nodule lateral aspect of the left lung that is stable in size no appreciable change in greater than 2 years. Presumed benign. The patient's images have been independently reviewed by me.     Pulmonary Functions Testing Results:     No data to display          FeNO:   Pathology:  Echocardiogram:   Heart Catheterization:     Assessment & Plan:     ICD-10-CM   1. Lung nodule  R91.1     2. Nodule of lower lobe of left lung  R91.1     3. Second hand smoke exposure  Z77.22         Discussion:  This is an 89 year old gentleman incidentally found left lower lobe pulmonary nodule has secondhand smoke exposure but is a lifelong non-smoker.  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  Stable lung nodule appears to be benign in etiology.  Has not changed in greater than 2 years.  Plan: No additional follow-up needed. He can return to see [place] as needed. Continue routine follow-up with primary care   Current Outpatient Medications:    alendronate (FOSAMAX) 70 MG tablet, Take 70 mg by mouth once a week. Take with a full glass of water on an empty stomach., Disp: , Rfl:    memantine (NAMENDA) 10 MG tablet, Take 10 mg by mouth at bedtime., Disp: , Rfl:    Garner Nash, DO [facility] 01/21/2022 3:35 PM

## 2022-02-05 DIAGNOSIS — R41841 Cognitive communication deficit: Secondary | ICD-10-CM | POA: Diagnosis not present

## 2022-03-08 DIAGNOSIS — R41841 Cognitive communication deficit: Secondary | ICD-10-CM | POA: Diagnosis not present

## 2022-04-22 DIAGNOSIS — E785 Hyperlipidemia, unspecified: Secondary | ICD-10-CM | POA: Diagnosis not present

## 2022-04-22 DIAGNOSIS — R413 Other amnesia: Secondary | ICD-10-CM | POA: Diagnosis not present

## 2022-04-22 DIAGNOSIS — D692 Other nonthrombocytopenic purpura: Secondary | ICD-10-CM | POA: Diagnosis not present

## 2022-04-22 DIAGNOSIS — F03A0 Unspecified dementia, mild, without behavioral disturbance, psychotic disturbance, mood disturbance, and anxiety: Secondary | ICD-10-CM | POA: Diagnosis not present

## 2022-04-22 DIAGNOSIS — Z8546 Personal history of malignant neoplasm of prostate: Secondary | ICD-10-CM | POA: Diagnosis not present

## 2022-04-22 DIAGNOSIS — N393 Stress incontinence (female) (male): Secondary | ICD-10-CM | POA: Diagnosis not present

## 2022-04-22 DIAGNOSIS — R911 Solitary pulmonary nodule: Secondary | ICD-10-CM | POA: Diagnosis not present

## 2022-04-22 DIAGNOSIS — R739 Hyperglycemia, unspecified: Secondary | ICD-10-CM | POA: Diagnosis not present

## 2022-04-22 DIAGNOSIS — I700 Atherosclerosis of aorta: Secondary | ICD-10-CM | POA: Diagnosis not present

## 2022-04-22 DIAGNOSIS — M8580 Other specified disorders of bone density and structure, unspecified site: Secondary | ICD-10-CM | POA: Diagnosis not present

## 2022-04-22 DIAGNOSIS — N401 Enlarged prostate with lower urinary tract symptoms: Secondary | ICD-10-CM | POA: Diagnosis not present

## 2022-04-22 DIAGNOSIS — G629 Polyneuropathy, unspecified: Secondary | ICD-10-CM | POA: Diagnosis not present

## 2022-07-26 DIAGNOSIS — Z961 Presence of intraocular lens: Secondary | ICD-10-CM | POA: Diagnosis not present

## 2022-07-26 DIAGNOSIS — H353132 Nonexudative age-related macular degeneration, bilateral, intermediate dry stage: Secondary | ICD-10-CM | POA: Diagnosis not present

## 2022-07-26 DIAGNOSIS — H348312 Tributary (branch) retinal vein occlusion, right eye, stable: Secondary | ICD-10-CM | POA: Diagnosis not present

## 2022-07-26 DIAGNOSIS — H31092 Other chorioretinal scars, left eye: Secondary | ICD-10-CM | POA: Diagnosis not present

## 2022-07-26 DIAGNOSIS — H35373 Puckering of macula, bilateral: Secondary | ICD-10-CM | POA: Diagnosis not present

## 2022-08-23 DIAGNOSIS — M8589 Other specified disorders of bone density and structure, multiple sites: Secondary | ICD-10-CM | POA: Diagnosis not present

## 2022-09-29 DIAGNOSIS — E559 Vitamin D deficiency, unspecified: Secondary | ICD-10-CM | POA: Diagnosis not present

## 2022-09-29 DIAGNOSIS — R739 Hyperglycemia, unspecified: Secondary | ICD-10-CM | POA: Diagnosis not present

## 2022-09-29 DIAGNOSIS — Z1389 Encounter for screening for other disorder: Secondary | ICD-10-CM | POA: Diagnosis not present

## 2022-09-29 DIAGNOSIS — D692 Other nonthrombocytopenic purpura: Secondary | ICD-10-CM | POA: Diagnosis not present

## 2022-09-29 DIAGNOSIS — E785 Hyperlipidemia, unspecified: Secondary | ICD-10-CM | POA: Diagnosis not present

## 2022-10-04 DIAGNOSIS — D692 Other nonthrombocytopenic purpura: Secondary | ICD-10-CM | POA: Diagnosis not present

## 2022-10-04 DIAGNOSIS — I700 Atherosclerosis of aorta: Secondary | ICD-10-CM | POA: Diagnosis not present

## 2022-10-04 DIAGNOSIS — M8580 Other specified disorders of bone density and structure, unspecified site: Secondary | ICD-10-CM | POA: Diagnosis not present

## 2022-10-04 DIAGNOSIS — Z8546 Personal history of malignant neoplasm of prostate: Secondary | ICD-10-CM | POA: Diagnosis not present

## 2022-10-04 DIAGNOSIS — N393 Stress incontinence (female) (male): Secondary | ICD-10-CM | POA: Diagnosis not present

## 2022-10-04 DIAGNOSIS — E785 Hyperlipidemia, unspecified: Secondary | ICD-10-CM | POA: Diagnosis not present

## 2022-10-04 DIAGNOSIS — Z0000 Encounter for general adult medical examination without abnormal findings: Secondary | ICD-10-CM | POA: Diagnosis not present

## 2022-10-04 DIAGNOSIS — Z1331 Encounter for screening for depression: Secondary | ICD-10-CM | POA: Diagnosis not present

## 2022-10-04 DIAGNOSIS — J449 Chronic obstructive pulmonary disease, unspecified: Secondary | ICD-10-CM | POA: Diagnosis not present

## 2022-10-04 DIAGNOSIS — G629 Polyneuropathy, unspecified: Secondary | ICD-10-CM | POA: Diagnosis not present

## 2022-10-04 DIAGNOSIS — F03B0 Unspecified dementia, moderate, without behavioral disturbance, psychotic disturbance, mood disturbance, and anxiety: Secondary | ICD-10-CM | POA: Diagnosis not present

## 2022-10-04 DIAGNOSIS — R82998 Other abnormal findings in urine: Secondary | ICD-10-CM | POA: Diagnosis not present

## 2022-10-04 DIAGNOSIS — Z23 Encounter for immunization: Secondary | ICD-10-CM | POA: Diagnosis not present

## 2022-12-20 DIAGNOSIS — D492 Neoplasm of unspecified behavior of bone, soft tissue, and skin: Secondary | ICD-10-CM | POA: Diagnosis not present

## 2022-12-20 DIAGNOSIS — C44311 Basal cell carcinoma of skin of nose: Secondary | ICD-10-CM | POA: Diagnosis not present

## 2023-01-21 DIAGNOSIS — I700 Atherosclerosis of aorta: Secondary | ICD-10-CM | POA: Diagnosis not present

## 2023-01-21 DIAGNOSIS — J449 Chronic obstructive pulmonary disease, unspecified: Secondary | ICD-10-CM | POA: Diagnosis not present

## 2023-01-21 DIAGNOSIS — R911 Solitary pulmonary nodule: Secondary | ICD-10-CM | POA: Diagnosis not present

## 2023-01-21 DIAGNOSIS — M8580 Other specified disorders of bone density and structure, unspecified site: Secondary | ICD-10-CM | POA: Diagnosis not present

## 2023-01-21 DIAGNOSIS — F03B0 Unspecified dementia, moderate, without behavioral disturbance, psychotic disturbance, mood disturbance, and anxiety: Secondary | ICD-10-CM | POA: Diagnosis not present

## 2023-01-21 DIAGNOSIS — D692 Other nonthrombocytopenic purpura: Secondary | ICD-10-CM | POA: Diagnosis not present

## 2023-01-21 DIAGNOSIS — R413 Other amnesia: Secondary | ICD-10-CM | POA: Diagnosis not present

## 2023-01-21 DIAGNOSIS — C44311 Basal cell carcinoma of skin of nose: Secondary | ICD-10-CM | POA: Diagnosis not present

## 2023-03-08 DIAGNOSIS — R278 Other lack of coordination: Secondary | ICD-10-CM | POA: Diagnosis not present

## 2023-03-08 DIAGNOSIS — F0150 Vascular dementia without behavioral disturbance: Secondary | ICD-10-CM | POA: Diagnosis not present

## 2023-03-08 DIAGNOSIS — R4701 Aphasia: Secondary | ICD-10-CM | POA: Diagnosis not present

## 2023-03-08 DIAGNOSIS — R2681 Unsteadiness on feet: Secondary | ICD-10-CM | POA: Diagnosis not present

## 2023-03-08 DIAGNOSIS — M6281 Muscle weakness (generalized): Secondary | ICD-10-CM | POA: Diagnosis not present

## 2023-03-08 DIAGNOSIS — R41841 Cognitive communication deficit: Secondary | ICD-10-CM | POA: Diagnosis not present

## 2023-03-29 DIAGNOSIS — J449 Chronic obstructive pulmonary disease, unspecified: Secondary | ICD-10-CM | POA: Diagnosis not present

## 2023-03-29 DIAGNOSIS — I700 Atherosclerosis of aorta: Secondary | ICD-10-CM | POA: Diagnosis not present

## 2023-03-29 DIAGNOSIS — M25561 Pain in right knee: Secondary | ICD-10-CM | POA: Diagnosis not present

## 2023-03-29 DIAGNOSIS — N401 Enlarged prostate with lower urinary tract symptoms: Secondary | ICD-10-CM | POA: Diagnosis not present

## 2023-03-29 DIAGNOSIS — Z8546 Personal history of malignant neoplasm of prostate: Secondary | ICD-10-CM | POA: Diagnosis not present

## 2023-03-29 DIAGNOSIS — G629 Polyneuropathy, unspecified: Secondary | ICD-10-CM | POA: Diagnosis not present

## 2023-03-29 DIAGNOSIS — E785 Hyperlipidemia, unspecified: Secondary | ICD-10-CM | POA: Diagnosis not present

## 2023-03-29 DIAGNOSIS — M8580 Other specified disorders of bone density and structure, unspecified site: Secondary | ICD-10-CM | POA: Diagnosis not present

## 2023-03-29 DIAGNOSIS — R739 Hyperglycemia, unspecified: Secondary | ICD-10-CM | POA: Diagnosis not present

## 2023-03-29 DIAGNOSIS — F03B0 Unspecified dementia, moderate, without behavioral disturbance, psychotic disturbance, mood disturbance, and anxiety: Secondary | ICD-10-CM | POA: Diagnosis not present

## 2023-03-29 DIAGNOSIS — N393 Stress incontinence (female) (male): Secondary | ICD-10-CM | POA: Diagnosis not present

## 2023-03-29 DIAGNOSIS — D692 Other nonthrombocytopenic purpura: Secondary | ICD-10-CM | POA: Diagnosis not present

## 2023-03-31 DIAGNOSIS — C44311 Basal cell carcinoma of skin of nose: Secondary | ICD-10-CM | POA: Diagnosis not present

## 2023-04-05 DIAGNOSIS — M6281 Muscle weakness (generalized): Secondary | ICD-10-CM | POA: Diagnosis not present

## 2023-04-05 DIAGNOSIS — F0150 Vascular dementia without behavioral disturbance: Secondary | ICD-10-CM | POA: Diagnosis not present

## 2023-04-05 DIAGNOSIS — R2681 Unsteadiness on feet: Secondary | ICD-10-CM | POA: Diagnosis not present

## 2023-04-05 DIAGNOSIS — R4701 Aphasia: Secondary | ICD-10-CM | POA: Diagnosis not present

## 2023-04-05 DIAGNOSIS — R41841 Cognitive communication deficit: Secondary | ICD-10-CM | POA: Diagnosis not present

## 2023-04-05 DIAGNOSIS — R278 Other lack of coordination: Secondary | ICD-10-CM | POA: Diagnosis not present

## 2023-05-05 DIAGNOSIS — F0150 Vascular dementia without behavioral disturbance: Secondary | ICD-10-CM | POA: Diagnosis not present

## 2023-05-05 DIAGNOSIS — M6281 Muscle weakness (generalized): Secondary | ICD-10-CM | POA: Diagnosis not present

## 2023-05-05 DIAGNOSIS — R278 Other lack of coordination: Secondary | ICD-10-CM | POA: Diagnosis not present

## 2023-05-05 DIAGNOSIS — R4701 Aphasia: Secondary | ICD-10-CM | POA: Diagnosis not present

## 2023-05-05 DIAGNOSIS — R2681 Unsteadiness on feet: Secondary | ICD-10-CM | POA: Diagnosis not present

## 2023-05-05 DIAGNOSIS — R41841 Cognitive communication deficit: Secondary | ICD-10-CM | POA: Diagnosis not present

## 2023-06-13 DIAGNOSIS — R278 Other lack of coordination: Secondary | ICD-10-CM | POA: Diagnosis not present

## 2023-06-13 DIAGNOSIS — R2681 Unsteadiness on feet: Secondary | ICD-10-CM | POA: Diagnosis not present

## 2023-06-13 DIAGNOSIS — R41841 Cognitive communication deficit: Secondary | ICD-10-CM | POA: Diagnosis not present

## 2023-06-13 DIAGNOSIS — R4701 Aphasia: Secondary | ICD-10-CM | POA: Diagnosis not present

## 2023-07-06 DIAGNOSIS — R278 Other lack of coordination: Secondary | ICD-10-CM | POA: Diagnosis not present

## 2023-07-06 DIAGNOSIS — R41841 Cognitive communication deficit: Secondary | ICD-10-CM | POA: Diagnosis not present

## 2023-07-06 DIAGNOSIS — R2681 Unsteadiness on feet: Secondary | ICD-10-CM | POA: Diagnosis not present

## 2023-07-06 DIAGNOSIS — R4701 Aphasia: Secondary | ICD-10-CM | POA: Diagnosis not present

## 2023-08-01 DIAGNOSIS — H353132 Nonexudative age-related macular degeneration, bilateral, intermediate dry stage: Secondary | ICD-10-CM | POA: Diagnosis not present

## 2023-08-01 DIAGNOSIS — Z961 Presence of intraocular lens: Secondary | ICD-10-CM | POA: Diagnosis not present

## 2023-08-01 DIAGNOSIS — H31092 Other chorioretinal scars, left eye: Secondary | ICD-10-CM | POA: Diagnosis not present

## 2023-08-01 DIAGNOSIS — H348312 Tributary (branch) retinal vein occlusion, right eye, stable: Secondary | ICD-10-CM | POA: Diagnosis not present

## 2023-08-01 DIAGNOSIS — H35373 Puckering of macula, bilateral: Secondary | ICD-10-CM | POA: Diagnosis not present

## 2023-08-05 DIAGNOSIS — R41841 Cognitive communication deficit: Secondary | ICD-10-CM | POA: Diagnosis not present

## 2023-09-05 DIAGNOSIS — R41841 Cognitive communication deficit: Secondary | ICD-10-CM | POA: Diagnosis not present

## 2023-10-06 DIAGNOSIS — R41841 Cognitive communication deficit: Secondary | ICD-10-CM | POA: Diagnosis not present

## 2023-10-24 DIAGNOSIS — E7849 Other hyperlipidemia: Secondary | ICD-10-CM | POA: Diagnosis not present

## 2023-10-24 DIAGNOSIS — R739 Hyperglycemia, unspecified: Secondary | ICD-10-CM | POA: Diagnosis not present

## 2023-10-24 DIAGNOSIS — Z1389 Encounter for screening for other disorder: Secondary | ICD-10-CM | POA: Diagnosis not present

## 2023-10-24 DIAGNOSIS — K219 Gastro-esophageal reflux disease without esophagitis: Secondary | ICD-10-CM | POA: Diagnosis not present

## 2023-10-24 DIAGNOSIS — Z125 Encounter for screening for malignant neoplasm of prostate: Secondary | ICD-10-CM | POA: Diagnosis not present

## 2023-10-24 DIAGNOSIS — E785 Hyperlipidemia, unspecified: Secondary | ICD-10-CM | POA: Diagnosis not present

## 2023-10-24 DIAGNOSIS — E559 Vitamin D deficiency, unspecified: Secondary | ICD-10-CM | POA: Diagnosis not present

## 2023-10-31 DIAGNOSIS — N200 Calculus of kidney: Secondary | ICD-10-CM | POA: Diagnosis not present

## 2023-10-31 DIAGNOSIS — Z0000 Encounter for general adult medical examination without abnormal findings: Secondary | ICD-10-CM | POA: Diagnosis not present

## 2023-10-31 DIAGNOSIS — G629 Polyneuropathy, unspecified: Secondary | ICD-10-CM | POA: Diagnosis not present

## 2023-10-31 DIAGNOSIS — Z8546 Personal history of malignant neoplasm of prostate: Secondary | ICD-10-CM | POA: Diagnosis not present

## 2023-10-31 DIAGNOSIS — H3530 Unspecified macular degeneration: Secondary | ICD-10-CM | POA: Diagnosis not present

## 2023-10-31 DIAGNOSIS — N393 Stress incontinence (female) (male): Secondary | ICD-10-CM | POA: Diagnosis not present

## 2023-10-31 DIAGNOSIS — N401 Enlarged prostate with lower urinary tract symptoms: Secondary | ICD-10-CM | POA: Diagnosis not present

## 2023-10-31 DIAGNOSIS — J449 Chronic obstructive pulmonary disease, unspecified: Secondary | ICD-10-CM | POA: Diagnosis not present

## 2023-10-31 DIAGNOSIS — F03B0 Unspecified dementia, moderate, without behavioral disturbance, psychotic disturbance, mood disturbance, and anxiety: Secondary | ICD-10-CM | POA: Diagnosis not present

## 2023-10-31 DIAGNOSIS — R82998 Other abnormal findings in urine: Secondary | ICD-10-CM | POA: Diagnosis not present

## 2023-10-31 DIAGNOSIS — K627 Radiation proctitis: Secondary | ICD-10-CM | POA: Diagnosis not present

## 2023-10-31 DIAGNOSIS — Z1331 Encounter for screening for depression: Secondary | ICD-10-CM | POA: Diagnosis not present

## 2023-10-31 DIAGNOSIS — E785 Hyperlipidemia, unspecified: Secondary | ICD-10-CM | POA: Diagnosis not present

## 2023-11-06 DIAGNOSIS — R41841 Cognitive communication deficit: Secondary | ICD-10-CM | POA: Diagnosis not present

## 2023-11-20 DIAGNOSIS — R41841 Cognitive communication deficit: Secondary | ICD-10-CM | POA: Diagnosis not present

## 2023-11-27 DIAGNOSIS — R41841 Cognitive communication deficit: Secondary | ICD-10-CM | POA: Diagnosis not present

## 2023-12-07 DIAGNOSIS — H353132 Nonexudative age-related macular degeneration, bilateral, intermediate dry stage: Secondary | ICD-10-CM | POA: Diagnosis not present

## 2023-12-07 DIAGNOSIS — H35373 Puckering of macula, bilateral: Secondary | ICD-10-CM | POA: Diagnosis not present

## 2023-12-07 DIAGNOSIS — H31092 Other chorioretinal scars, left eye: Secondary | ICD-10-CM | POA: Diagnosis not present

## 2023-12-07 DIAGNOSIS — H348312 Tributary (branch) retinal vein occlusion, right eye, stable: Secondary | ICD-10-CM | POA: Diagnosis not present
